# Patient Record
Sex: Female | Born: 1960 | Race: Black or African American | Hispanic: No | Marital: Single | Smoking: Never smoker
Health system: Southern US, Community
[De-identification: ages and names within clinical notes are randomized; demographics above are authoritative.]

## PROBLEM LIST (undated history)

## (undated) DIAGNOSIS — D649 Anemia, unspecified: Secondary | ICD-10-CM

## (undated) HISTORY — PX: DIAGNOSTIC LAPAROSCOPY: SUR761

## (undated) HISTORY — PX: OTHER SURGICAL HISTORY: SHX169

## (undated) HISTORY — PX: ABDOMINAL HYSTERECTOMY: SHX81

---

## 1998-03-30 ENCOUNTER — Other Ambulatory Visit: Admission: RE | Admit: 1998-03-30 | Discharge: 1998-03-30 | Payer: Self-pay | Admitting: Obstetrics and Gynecology

## 1998-04-20 ENCOUNTER — Ambulatory Visit (HOSPITAL_COMMUNITY): Admission: RE | Admit: 1998-04-20 | Discharge: 1998-04-20 | Payer: Self-pay | Admitting: Obstetrics and Gynecology

## 2001-04-19 ENCOUNTER — Emergency Department (HOSPITAL_COMMUNITY): Admission: EM | Admit: 2001-04-19 | Discharge: 2001-04-19 | Payer: Self-pay | Admitting: Emergency Medicine

## 2001-04-19 ENCOUNTER — Encounter: Payer: Self-pay | Admitting: Emergency Medicine

## 2002-07-01 ENCOUNTER — Emergency Department (HOSPITAL_COMMUNITY): Admission: EM | Admit: 2002-07-01 | Discharge: 2002-07-01 | Payer: Self-pay | Admitting: Emergency Medicine

## 2008-04-02 ENCOUNTER — Emergency Department (HOSPITAL_COMMUNITY): Admission: EM | Admit: 2008-04-02 | Discharge: 2008-04-02 | Payer: Self-pay | Admitting: Emergency Medicine

## 2010-02-16 ENCOUNTER — Encounter: Payer: Self-pay | Admitting: Obstetrics and Gynecology

## 2010-07-17 ENCOUNTER — Other Ambulatory Visit: Payer: Self-pay | Admitting: Obstetrics and Gynecology

## 2010-07-17 DIAGNOSIS — N63 Unspecified lump in unspecified breast: Secondary | ICD-10-CM

## 2010-07-22 ENCOUNTER — Other Ambulatory Visit: Payer: Self-pay

## 2010-07-23 ENCOUNTER — Ambulatory Visit
Admission: RE | Admit: 2010-07-23 | Discharge: 2010-07-23 | Disposition: A | Discharge status: Home / Self Care | Payer: BC Managed Care – PPO | Source: Ambulatory Visit | Attending: Obstetrics and Gynecology | Admitting: Obstetrics and Gynecology

## 2010-07-23 ENCOUNTER — Other Ambulatory Visit: Payer: Self-pay | Admitting: Obstetrics and Gynecology

## 2010-07-23 DIAGNOSIS — N63 Unspecified lump in unspecified breast: Secondary | ICD-10-CM

## 2011-05-05 ENCOUNTER — Encounter (HOSPITAL_COMMUNITY): Payer: Self-pay | Admitting: Pharmacist

## 2011-05-13 NOTE — H&P (Signed)
  Patient name  Louanna, Vanliew DICTATION#  [id number] CSN# [id number]  [facility], MD 05/13/2011 5:32 AM

## 2011-05-14 ENCOUNTER — Encounter (HOSPITAL_COMMUNITY)
Admission: RE | Admit: 2011-05-14 | Discharge: 2011-05-14 | Disposition: A | Discharge status: Home / Self Care | Payer: BC Managed Care – PPO | Source: Ambulatory Visit | Attending: Obstetrics and Gynecology | Admitting: Obstetrics and Gynecology

## 2011-05-14 ENCOUNTER — Encounter (HOSPITAL_COMMUNITY): Payer: Self-pay

## 2011-05-14 HISTORY — DX: Anemia, unspecified: D64.9

## 2011-05-14 LAB — SURGICAL PCR SCREEN
MRSA, PCR: NEGATIVE
Staphylococcus aureus: NEGATIVE

## 2011-05-14 LAB — CBC
Hemoglobin: 12.5 g/dL (ref 12.0–15.0)
MCH: 31 pg (ref 26.0–34.0)
MCHC: 33 g/dL (ref 30.0–36.0)
RDW: 12.5 % (ref 11.5–15.5)

## 2011-05-14 NOTE — Patient Instructions (Addendum)
   Your procedure is scheduled on: Monday April 22nd  Enter through the Main Entrance of [facility] at: [employer] up the phone at the desk and dial [phone] and inform us of your arrival.  Please call this number if you have any problems the morning of surgery: [phone]  Remember: Do not eat food after midnight: Sunday Do not drink clear liquids after: midnight Sunday Take these medicines the morning of surgery with a SIP OF WATER: none  Do not wear jewelry, make-up, or FINGER nail polish Do not wear lotions, powders, perfumes or deodorant. Do not shave 48 hours prior to surgery. Do not bring valuables to the hospital. Contacts, dentures or bridgework may not be worn into surgery.  Leave suitcase in the car. After Surgery it may be brought to your room. For patients being admitted to the hospital, checkout time is 11:00am the day of discharge.  Patients discharged on the day of surgery will not be allowed to drive home.     Remember to use your hibiclens as instructed.Please shower with 1/2 bottle the evening before your surgery and the other 1/2 bottle the morning of surgery. Neck down avoiding private area.

## 2011-05-18 NOTE — H&P (Signed)
Joanna Klein is an 51 y.o. female. G 2 P 2 presents for LAVH and BSO for symptomatic fibroids and associated anemia.  Patient with very heavy periods. HGB has been as low as 6.4.  Alternatives discussed presents for above noted surgery.  Pertinent Gynecological History: Menses: flow is excessive with use of 10 pads or tampons on heaviest days Bleeding: excessive Contraception: OCP (estrogen/progesterone) DES exposure: denies Blood transfusions: none Sexually transmitted diseases: no past history Previous GYN Procedures: none  Last mammogram: normal Last pap: normal Date: 11/11 OB History: G2, P2   Menstrual History: Menarche age: 62 No LMP recorded.    Past Medical History  Diagnosis Date  . Anemia     Past Surgical History  Procedure Date  . Diagnostic laparoscopy     laser for endometriosis  . Abcess removal age 34    on buttocks    No family history on file.  Social History:  reports that she has never smoked. She does not have any smokeless tobacco history on file. She reports that she does not drink alcohol or use illicit drugs.  Allergies: No Known Allergies  No prescriptions prior to admission    Review of Systems  All other systems reviewed and are negative.    There were no vitals taken for this visit. Physical Exam  Constitutional: She is oriented to person, place, and time. She appears well-developed and well-nourished.  HENT:  Head: Normocephalic.  Mouth/Throat: Oropharynx is clear and moist.  Eyes: Conjunctivae and EOM are normal. Pupils are equal, round, and reactive to light.  Neck: Normal range of motion. Neck supple.  Cardiovascular: Normal rate, regular rhythm and normal heart sounds.   Respiratory: Effort normal and breath sounds normal.  GI: Soft. Bowel sounds are normal. She exhibits no mass.  Genitourinary:       Normal external genitalia.  Vagina and cervix clear.  Uterus 9 weeks in size consistent with fibroids.  Adnexa clear    Musculoskeletal: Normal range of motion.  Neurological: She is alert and oriented to person, place, and time. She has normal reflexes.    No results found for this or any previous visit (from the past 24 hour(s)).  No results found.  Assessment/Plan: Uterine fibroids with associated menorrhagia.   Pan LAVH and  BSO.  Risk discussed including: Hemorrhage that could lead to transfusion with risk of AIDS and hepatitis.  Risk of infection. Risk of injury to adjacent organs such as bowel bladder of ureters that could require further surgery. Risk of DVT and PE.  Patient expresses understanding of risk and alternatives.   Shareef Eddinger S 05/18/2011, 6:39 AM

## 2011-05-19 ENCOUNTER — Encounter (HOSPITAL_COMMUNITY): Payer: Self-pay | Admitting: Anesthesiology

## 2011-05-19 ENCOUNTER — Ambulatory Visit (HOSPITAL_COMMUNITY)
Admission: RE | Admit: 2011-05-19 | Discharge: 2011-05-20 | Disposition: A | Discharge status: Home / Self Care | Payer: BC Managed Care – PPO | Source: Ambulatory Visit | Attending: Obstetrics and Gynecology | Admitting: Obstetrics and Gynecology

## 2011-05-19 ENCOUNTER — Encounter (HOSPITAL_COMMUNITY)
Admission: RE | Disposition: A | Discharge status: Home / Self Care | Payer: Self-pay | Source: Ambulatory Visit | Attending: Obstetrics and Gynecology

## 2011-05-19 ENCOUNTER — Encounter (HOSPITAL_COMMUNITY): Payer: Self-pay

## 2011-05-19 ENCOUNTER — Ambulatory Visit (HOSPITAL_COMMUNITY): Payer: BC Managed Care – PPO | Admitting: Anesthesiology

## 2011-05-19 DIAGNOSIS — D649 Anemia, unspecified: Secondary | ICD-10-CM | POA: Insufficient documentation

## 2011-05-19 DIAGNOSIS — Z01812 Encounter for preprocedural laboratory examination: Secondary | ICD-10-CM | POA: Insufficient documentation

## 2011-05-19 DIAGNOSIS — Z01818 Encounter for other preprocedural examination: Secondary | ICD-10-CM | POA: Insufficient documentation

## 2011-05-19 DIAGNOSIS — Z90710 Acquired absence of both cervix and uterus: Secondary | ICD-10-CM

## 2011-05-19 DIAGNOSIS — D259 Leiomyoma of uterus, unspecified: Secondary | ICD-10-CM | POA: Insufficient documentation

## 2011-05-19 DIAGNOSIS — N831 Corpus luteum cyst of ovary, unspecified side: Secondary | ICD-10-CM | POA: Insufficient documentation

## 2011-05-19 DIAGNOSIS — N840 Polyp of corpus uteri: Secondary | ICD-10-CM | POA: Insufficient documentation

## 2011-05-19 DIAGNOSIS — N920 Excessive and frequent menstruation with regular cycle: Secondary | ICD-10-CM | POA: Insufficient documentation

## 2011-05-19 DIAGNOSIS — N838 Other noninflammatory disorders of ovary, fallopian tube and broad ligament: Secondary | ICD-10-CM | POA: Insufficient documentation

## 2011-05-19 HISTORY — PX: LAPAROSCOPIC ASSISTED VAGINAL HYSTERECTOMY: SHX5398

## 2011-05-19 HISTORY — PX: SALPINGOOPHORECTOMY: SHX82

## 2011-05-19 LAB — HCG, SERUM, QUALITATIVE: Preg, Serum: NEGATIVE

## 2011-05-19 SURGERY — HYSTERECTOMY, VAGINAL, LAPAROSCOPY-ASSISTED
Anesthesia: General | Site: Abdomen | Wound class: Clean Contaminated

## 2011-05-19 MED ORDER — BUPIVACAINE HCL (PF) 0.25 % IJ SOLN
INTRAMUSCULAR | Status: DC | PRN
  Administered 2011-05-19: 5 mL

## 2011-05-19 MED ORDER — ONDANSETRON HCL 4 MG/2ML IJ SOLN: 4.0000 mg | Freq: Four times a day (QID) | INTRAMUSCULAR | Status: DC | PRN

## 2011-05-19 MED ORDER — PROPOFOL 10 MG/ML IV EMUL
INTRAVENOUS | Status: DC | PRN
  Administered 2011-05-19: 170 mg via INTRAVENOUS

## 2011-05-19 MED ORDER — METOCLOPRAMIDE HCL 5 MG/ML IJ SOLN
INTRAMUSCULAR | Status: AC
  Filled 2011-05-19: qty 2

## 2011-05-19 MED ORDER — LIDOCAINE HCL (CARDIAC) 20 MG/ML IV SOLN
INTRAVENOUS | Status: AC
  Filled 2011-05-19: qty 5

## 2011-05-19 MED ORDER — PROPOFOL 10 MG/ML IV EMUL
INTRAVENOUS | Status: AC
  Filled 2011-05-19: qty 20

## 2011-05-19 MED ORDER — SODIUM CHLORIDE 0.9 % IJ SOLN: 9.0000 mL | INTRAMUSCULAR | Status: DC | PRN

## 2011-05-19 MED ORDER — HYDROMORPHONE HCL PF 1 MG/ML IJ SOLN
INTRAMUSCULAR | Status: AC
  Administered 2011-05-19: 0.5 mg via INTRAVENOUS
  Filled 2011-05-19: qty 1

## 2011-05-19 MED ORDER — HYDROMORPHONE 0.3 MG/ML IV SOLN
INTRAVENOUS | Status: AC
  Filled 2011-05-19: qty 25

## 2011-05-19 MED ORDER — ACETAMINOPHEN 325 MG PO TABS: 650.0000 mg | ORAL_TABLET | ORAL | Status: DC | PRN

## 2011-05-19 MED ORDER — BUPIVACAINE-EPINEPHRINE (PF) 0.5% -1:200000 IJ SOLN
INTRAMUSCULAR | Status: AC
  Filled 2011-05-19: qty 10

## 2011-05-19 MED ORDER — ONDANSETRON HCL 4 MG/2ML IJ SOLN
INTRAMUSCULAR | Status: AC
  Filled 2011-05-19: qty 2

## 2011-05-19 MED ORDER — ESMOLOL HCL 10 MG/ML IV SOLN: INTRAVENOUS | Status: DC | PRN

## 2011-05-19 MED ORDER — DIPHENHYDRAMINE HCL 50 MG/ML IJ SOLN: 12.5000 mg | Freq: Four times a day (QID) | INTRAMUSCULAR | Status: DC | PRN

## 2011-05-19 MED ORDER — ZOLPIDEM TARTRATE 5 MG PO TABS: 5.0000 mg | ORAL_TABLET | Freq: Every evening | ORAL | Status: DC | PRN

## 2011-05-19 MED ORDER — ONDANSETRON HCL 4 MG/2ML IJ SOLN
INTRAMUSCULAR | Status: DC | PRN
  Administered 2011-05-19: 4 mg via INTRAVENOUS

## 2011-05-19 MED ORDER — BUPIVACAINE HCL (PF) 0.25 % IJ SOLN
INTRAMUSCULAR | Status: AC
  Filled 2011-05-19: qty 30

## 2011-05-19 MED ORDER — MIDAZOLAM HCL 2 MG/2ML IJ SOLN
INTRAMUSCULAR | Status: AC
  Filled 2011-05-19: qty 2

## 2011-05-19 MED ORDER — LACTATED RINGERS IV SOLN
INTRAVENOUS | Status: DC
  Administered 2011-05-19: 08:00:00 via INTRAVENOUS
  Administered 2011-05-19: 125 mL/h via INTRAVENOUS

## 2011-05-19 MED ORDER — HYDROMORPHONE 0.3 MG/ML IV SOLN
INTRAVENOUS | Status: DC
  Administered 2011-05-19: 0.2 mg via INTRAVENOUS
  Administered 2011-05-19: 11:00:00 via INTRAVENOUS

## 2011-05-19 MED ORDER — MENTHOL 3 MG MT LOZG: 1.0000 | LOZENGE | OROMUCOSAL | Status: DC | PRN

## 2011-05-19 MED ORDER — OXYCODONE-ACETAMINOPHEN 5-325 MG PO TABS: 1.0000 | ORAL_TABLET | ORAL | Status: DC | PRN

## 2011-05-19 MED ORDER — PHENYLEPHRINE HCL 10 MG/ML IJ SOLN
INTRAMUSCULAR | Status: DC | PRN
  Administered 2011-05-19: 80 ug via INTRAVENOUS

## 2011-05-19 MED ORDER — PROMETHAZINE HCL 25 MG/ML IJ SOLN
6.2500 mg | INTRAMUSCULAR | Status: DC | PRN
  Administered 2011-05-19: 6.25 mg via INTRAVENOUS

## 2011-05-19 MED ORDER — DIPHENHYDRAMINE HCL 12.5 MG/5ML PO ELIX
12.5000 mg | ORAL_SOLUTION | Freq: Four times a day (QID) | ORAL | Status: DC | PRN
Start: 2011-05-19 — End: 2011-05-20

## 2011-05-19 MED ORDER — MIDAZOLAM HCL 5 MG/5ML IJ SOLN
INTRAMUSCULAR | Status: DC | PRN
  Administered 2011-05-19: 2 mg via INTRAVENOUS

## 2011-05-19 MED ORDER — INDIGOTINDISULFONATE SODIUM 8 MG/ML IJ SOLN
INTRAMUSCULAR | Status: DC | PRN
  Administered 2011-05-19: 5 mL via INTRAVENOUS

## 2011-05-19 MED ORDER — STERILE WATER FOR IRRIGATION IR SOLN
Status: DC | PRN
  Administered 2011-05-19: 1000 mL

## 2011-05-19 MED ORDER — KETOROLAC TROMETHAMINE 30 MG/ML IJ SOLN: 15.0000 mg | Freq: Once | INTRAMUSCULAR | Status: DC | PRN

## 2011-05-19 MED ORDER — CEFAZOLIN SODIUM 1-5 GM-% IV SOLN
1.0000 g | INTRAVENOUS | Status: AC
  Administered 2011-05-19: 1 g via INTRAVENOUS

## 2011-05-19 MED ORDER — FENTANYL CITRATE 0.05 MG/ML IJ SOLN
INTRAMUSCULAR | Status: AC
  Filled 2011-05-19: qty 5

## 2011-05-19 MED ORDER — LACTATED RINGERS IR SOLN
Status: DC | PRN
  Administered 2011-05-19: 3000 mL

## 2011-05-19 MED ORDER — PROMETHAZINE HCL 25 MG/ML IJ SOLN
INTRAMUSCULAR | Status: AC
  Administered 2011-05-19: 6.25 mg via INTRAVENOUS
  Filled 2011-05-19: qty 1

## 2011-05-19 MED ORDER — FENTANYL CITRATE 0.05 MG/ML IJ SOLN
INTRAMUSCULAR | Status: DC | PRN
  Administered 2011-05-19: 100 ug via INTRAVENOUS
  Administered 2011-05-19 (×2): 50 ug via INTRAVENOUS
  Administered 2011-05-19: 25 ug via INTRAVENOUS
  Administered 2011-05-19 (×2): 50 ug via INTRAVENOUS
  Administered 2011-05-19: 25 ug via INTRAVENOUS
  Administered 2011-05-19: 50 ug via INTRAVENOUS

## 2011-05-19 MED ORDER — LACTATED RINGERS IV SOLN
INTRAVENOUS | Status: DC
  Administered 2011-05-19 – 2011-05-20 (×3): via INTRAVENOUS

## 2011-05-19 MED ORDER — PHENYLEPHRINE 40 MCG/ML (10ML) SYRINGE FOR IV PUSH (FOR BLOOD PRESSURE SUPPORT)
PREFILLED_SYRINGE | INTRAVENOUS | Status: AC
  Filled 2011-05-19: qty 5

## 2011-05-19 MED ORDER — DEXAMETHASONE SODIUM PHOSPHATE 10 MG/ML IJ SOLN
INTRAMUSCULAR | Status: DC | PRN
  Administered 2011-05-19: 10 mg via INTRAVENOUS

## 2011-05-19 MED ORDER — INDIGOTINDISULFONATE SODIUM 8 MG/ML IJ SOLN
INTRAMUSCULAR | Status: AC
  Filled 2011-05-19: qty 5

## 2011-05-19 MED ORDER — FENTANYL CITRATE 0.05 MG/ML IJ SOLN
INTRAMUSCULAR | Status: AC
Start: 2011-05-19 — End: 2011-05-19
  Filled 2011-05-19: qty 5

## 2011-05-19 MED ORDER — NALOXONE HCL 0.4 MG/ML IJ SOLN: 0.4000 mg | INTRAMUSCULAR | Status: DC | PRN

## 2011-05-19 MED ORDER — ROCURONIUM BROMIDE 50 MG/5ML IV SOLN
INTRAVENOUS | Status: AC
  Filled 2011-05-19: qty 1

## 2011-05-19 MED ORDER — KETOROLAC TROMETHAMINE 30 MG/ML IJ SOLN
INTRAMUSCULAR | Status: AC
  Filled 2011-05-19: qty 1

## 2011-05-19 MED ORDER — ROCURONIUM BROMIDE 100 MG/10ML IV SOLN
INTRAVENOUS | Status: DC | PRN
  Administered 2011-05-19: 40 mg via INTRAVENOUS

## 2011-05-19 MED ORDER — ONDANSETRON HCL 4 MG PO TABS: 4.0000 mg | ORAL_TABLET | Freq: Four times a day (QID) | ORAL | Status: DC | PRN

## 2011-05-19 MED ORDER — ESMOLOL HCL 10 MG/ML IV SOLN
INTRAVENOUS | Status: AC
  Filled 2011-05-19: qty 10

## 2011-05-19 MED ORDER — METOCLOPRAMIDE HCL 5 MG/ML IJ SOLN: 10.0000 mg | Freq: Once | INTRAMUSCULAR | Status: DC

## 2011-05-19 MED ORDER — KETOROLAC TROMETHAMINE 30 MG/ML IJ SOLN
INTRAMUSCULAR | Status: DC | PRN
  Administered 2011-05-19: 30 mg via INTRAVENOUS

## 2011-05-19 MED ORDER — LIDOCAINE HCL (CARDIAC) 20 MG/ML IV SOLN
INTRAVENOUS | Status: DC | PRN
  Administered 2011-05-19: 50 mg via INTRAVENOUS

## 2011-05-19 MED ORDER — DEXAMETHASONE SODIUM PHOSPHATE 10 MG/ML IJ SOLN
INTRAMUSCULAR | Status: AC
  Filled 2011-05-19: qty 1

## 2011-05-19 MED ORDER — CEFAZOLIN SODIUM 1-5 GM-% IV SOLN
INTRAVENOUS | Status: AC
  Filled 2011-05-19: qty 50

## 2011-05-19 MED ORDER — HYDROMORPHONE HCL PF 1 MG/ML IJ SOLN
0.2500 mg | INTRAMUSCULAR | Status: DC | PRN
  Administered 2011-05-19 (×2): 0.5 mg via INTRAVENOUS

## 2011-05-19 MED ORDER — ESMOLOL HCL 10 MG/ML IV SOLN
INTRAVENOUS | Status: DC | PRN
  Administered 2011-05-19: 30 mg via INTRAVENOUS

## 2011-05-19 SURGICAL SUPPLY — 38 items
CABLE HIGH FREQUENCY MONO STRZ (ELECTRODE) IMPLANT
CATH ROBINSON RED A/P 16FR (CATHETERS) ×3 IMPLANT
CLOTH BEACON ORANGE TIMEOUT ST (SAFETY) ×3 IMPLANT
CONT PATH 16OZ SNAP LID 3702 (MISCELLANEOUS) ×3 IMPLANT
COVER TABLE BACK 60X90 (DRAPES) ×3 IMPLANT
DECANTER SPIKE VIAL GLASS SM (MISCELLANEOUS) IMPLANT
DERMABOND ADVANCED (GAUZE/BANDAGES/DRESSINGS) ×1
DERMABOND ADVANCED .7 DNX12 (GAUZE/BANDAGES/DRESSINGS) ×2 IMPLANT
ELECT REM PT RETURN 9FT ADLT (ELECTROSURGICAL) ×3
ELECTRODE REM PT RTRN 9FT ADLT (ELECTROSURGICAL) ×2 IMPLANT
GLOVE BIO SURGEON STRL SZ7 (GLOVE) ×9 IMPLANT
GLOVE BIOGEL PI IND STRL 6.5 (GLOVE) ×2 IMPLANT
GLOVE BIOGEL PI INDICATOR 6.5 (GLOVE) ×1
GOWN PREVENTION PLUS LG XLONG (DISPOSABLE) ×9 IMPLANT
NEEDLE INSUFFLATION 14GA 120MM (NEEDLE) IMPLANT
NS IRRIG 1000ML POUR BTL (IV SOLUTION) ×3 IMPLANT
PACK LAVH (CUSTOM PROCEDURE TRAY) ×3 IMPLANT
PROTECTOR NERVE ULNAR (MISCELLANEOUS) ×3 IMPLANT
SEALER TISSUE G2 CVD JAW 45CM (ENDOMECHANICALS) ×3 IMPLANT
SET CYSTO W/LG BORE CLAMP LF (SET/KITS/TRAYS/PACK) ×3 IMPLANT
SET IRRIG TUBING LAPAROSCOPIC (IRRIGATION / IRRIGATOR) ×3 IMPLANT
STRIP CLOSURE SKIN 1/4X3 (GAUZE/BANDAGES/DRESSINGS) IMPLANT
SUT MON AB 2-0 CT1 27 (SUTURE) ×9 IMPLANT
SUT VIC AB 0 CT1 18XCR BRD8 (SUTURE) ×4 IMPLANT
SUT VIC AB 0 CT1 27 (SUTURE) ×1
SUT VIC AB 0 CT1 27XBRD ANBCTR (SUTURE) ×2 IMPLANT
SUT VIC AB 0 CT1 36 (SUTURE) ×6 IMPLANT
SUT VIC AB 0 CT1 8-18 (SUTURE) ×2
SUT VICRYL 0 UR6 27IN ABS (SUTURE) ×6 IMPLANT
SUT VICRYL 1 TIES 12X18 (SUTURE) ×3 IMPLANT
SUT VICRYL 4-0 PS2 18IN ABS (SUTURE) ×3 IMPLANT
TOWEL OR 17X24 6PK STRL BLUE (TOWEL DISPOSABLE) ×6 IMPLANT
TRAY FOLEY CATH 14FR (SET/KITS/TRAYS/PACK) ×3 IMPLANT
TROCAR BALLN 12MMX100 BLUNT (TROCAR) ×3 IMPLANT
TROCAR Z-THREAD BLADED 11X100M (TROCAR) IMPLANT
TROCAR Z-THREAD BLADED 5X100MM (TROCAR) ×3 IMPLANT
WARMER LAPAROSCOPE (MISCELLANEOUS) ×3 IMPLANT
WATER STERILE IRR 1000ML POUR (IV SOLUTION) IMPLANT

## 2011-05-19 NOTE — Anesthesia Preprocedure Evaluation (Signed)
Anesthesia Evaluation  Patient identified by MRN, date of birth, ID band Patient awake    Reviewed: Allergy & Precautions, H&P , NPO status , Patient's Chart, lab work & pertinent test results, reviewed documented beta blocker date and time   History of Anesthesia Complications Negative for: history of anesthetic complications  Airway Mallampati: I TM Distance: >3 FB Neck ROM: full    Dental  (+) Teeth Intact   Pulmonary neg pulmonary ROS,  breath sounds clear to auscultation  Pulmonary exam normal       Cardiovascular Exercise Tolerance: Good negative cardio ROS  Rhythm:regular Rate:Normal     Neuro/Psych negative neurological ROS  negative psych ROS   GI/Hepatic negative GI ROS, Neg liver ROS,   Endo/Other  negative endocrine ROS  Renal/GU negative Renal ROS  Female GU complaint     Musculoskeletal   Abdominal   Peds  Hematology  (+) anemia ,   Anesthesia Other Findings   Reproductive/Obstetrics negative OB ROS                           Anesthesia Physical Anesthesia Plan  ASA: I  Anesthesia Plan: General ETT   Post-op Pain Management:    Induction:   Airway Management Planned:   Additional Equipment:   Intra-op Plan:   Post-operative Plan:   Informed Consent: I have reviewed the patients History and Physical, chart, labs and discussed the procedure including the risks, benefits and alternatives for the proposed anesthesia with the patient or authorized representative who has indicated his/her understanding and acceptance.   Dental Advisory Given  Plan Discussed with: CRNA and Surgeon  Anesthesia Plan Comments:         Anesthesia Quick Evaluation

## 2011-05-19 NOTE — Brief Op Note (Signed)
05/19/2011  9:05 AM  PATIENT:  Joanna Klein  51 y.o. female  PRE-OPERATIVE DIAGNOSIS:  Menorrhagia, Fibroids  POST-OPERATIVE DIAGNOSIS:  Menorrhagia, Fibroids  PROCEDURE:  Procedure(s) (LRB): LAPAROSCOPIC ASSISTED VAGINAL HYSTERECTOMY (N/A) SALPINGO OOPHERECTOMY (Bilateral)  SURGEON:  Surgeon(s) and Role:    * Juluis Mire, MD - Primary    * Meriel Pica, MD - Assisting  PHYSICIAN ASSISTANT:   ASSISTANTS: holland    ANESTHESIA:   general  EBL:  Total I/O In: 1000 [I.V.:1000] Out: 300 [Urine:50; Blood:250]  BLOOD ADMINISTERED:none  DRAINS: Urinary Catheter (Foley)   LOCAL MEDICATIONS USED:  MARCAINE     SPECIMEN:  Source of Specimen:  uterus tubes and ovaries  DISPOSITION OF SPECIMEN:  PATHOLOGY  COUNTS:  YES  TOURNIQUET:  * No tourniquets in log *  DICTATION: .Other Dictation: Dictation Number [id number]  PLAN OF CARE: Admit for overnight observation  PATIENT DISPOSITION:  PACU - hemodynamically stable.   Delay start of Pharmacological VTE agent (>24hrs) due to surgical blood loss or risk of bleeding: no

## 2011-05-19 NOTE — Addendum Note (Signed)
Addendum  created 05/19/11 1626 by Armanda Heritage, RN   Modules edited:Anesthesia Events, Notes Section

## 2011-05-19 NOTE — Transfer of Care (Signed)
Immediate Anesthesia Transfer of Care Note  Patient: Joanna Klein  Procedure(s) Performed: Procedure(s) (LRB): LAPAROSCOPIC ASSISTED VAGINAL HYSTERECTOMY (N/A) SALPINGO OOPHERECTOMY (Bilateral)  Patient Location: PACU  Anesthesia Type: General  Level of Consciousness: awake and sedated  Airway & Oxygen Therapy: Patient Spontanous Breathing and Patient connected to nasal cannula oxygen  Post-op Assessment: Report given to PACU RN  Post vital signs: Reviewed and stable  Complications: No apparent anesthesia complications

## 2011-05-19 NOTE — Anesthesia Postprocedure Evaluation (Signed)
  Anesthesia Post-op Note  Patient: Joanna Klein  Procedure(s) Performed: Procedure(s) (LRB): LAPAROSCOPIC ASSISTED VAGINAL HYSTERECTOMY (N/A) SALPINGO OOPHERECTOMY (Bilateral)  Patient Location: PACU and Women's Unit  Anesthesia Type: General  Level of Consciousness: awake, alert  and oriented  Airway and Oxygen Therapy: Patient connected to nasal cannula oxygen  Post-op Pain: mild  Post-op Assessment: Post-op Vital signs reviewed  Post-op Vital Signs: Reviewed and stable  Complications: No apparent anesthesia complications

## 2011-05-19 NOTE — Progress Notes (Signed)
Patient ID: Joanna Klein, female   DOB: 04-11-1960, 51 y.o.   MRN: [id number] AF VSS ABD SOFT  DRESSING DRY GOOD UO

## 2011-05-19 NOTE — Op Note (Signed)
NAMEDAPHENE, CHISHOLM NO.:  [id number]  MEDICAL RECORD NO.:  [id number]  LOCATION:  [facility]                          FACILITY:  [facility]  PHYSICIAN:  Juluis Mire, M.D.   DATE OF BIRTH:  [birth year]  DATE OF PROCEDURE:  05/19/2011 DATE OF DISCHARGE:                              OPERATIVE REPORT   PREOPERATIVE DIAGNOSIS:  Uterine fibroids.  POSTOPERATIVE DIAGNOSIS:  Uterine fibroids.  OPERATIVE PROCEDURE:  Laparoscopic-assisted vaginal hysterectomy with bilateral salpingo-oophorectomy.  SURGEON:  Juluis Mire, MD.  ASSISTANT:  Duke Salvia. Marcelle Overlie, MD.  ANESTHESIA:  General endotracheal.  ESTIMATED BLOOD LOSS:  300-400 mL.  PACKS:  None.  DRAINS:  Included urethral Foley.  INTRAOPERATIVE BLOOD REPLACEMENT:  None.  COMPLICATIONS:  None.  INDICATIONS:  As dictated in history and physical.  PROCEDURE IN DETAIL:  The patient was taken to the OR and placed in supine position.  After satisfactory level of general endotracheal anesthesia was obtained, the patient was placed in the dorsal lithotomy position using Allen stirrups.  The abdomen, perineum, and vagina prepped out with Betadine.  Bladder was then emptied by in and out catheterization.  A Hulka tenaculum was put in place and secured.  The patient was then draped in a sterile field.  Subumbilical incision was made with a knife.  The incision was extended through the subcutaneous tissue down to the fascia.  The fascia was entered sharply.  The peritoneum was entered with blunt finger pressure.  An open laparoscopic trocar was put in place and secured.  The abdomen was insufflated with carbon dioxide.  Laparoscope was introduced.  There was no evidence of injury to adjacent organs.  A 5-mm trocar was put in place under direct visualization.  Visualization revealed the appendix to be normal.  Upper abdomen including liver, tip of the gallbladder were clear.  Uterus was enlarged with multiple fibroids.   Tubes and ovaries were unremarkable. The ureters could be seen coursing in the pelvic sidewall well above the ovarian vasculature.  First went to the left side.  Using the enseal, the left ovarian vasculature was cauterized and incised.  The mesenteric attachments to the ovary and tubes were cauterized and incised up to the round ligament, then the round ligament was cauterized and incised.  We then went to the right side, where the right ovarian vasculature was cauterized and incised using the enseal.  The mesenteric attachments were cauterized, incised up to the round ligament.  The round ligament was cauterized and incised.  At this point, the decision was to go vaginally.  The abdomen was deflated with carbon dioxide.  The laparoscope was removed.  The patient's legs were repositioned.  The Hulka tenaculum was removed. Weighted speculum was placed in the vaginal vault.  The cervix was grasped with Christella Hartigan tenaculum.  Cul-de-sac was entered sharply.  Both uterosacral ligaments were clamped, cut, and suture ligated with 0 Vicryl.  The reflection of the vaginal mucosa anteriorly was incised and bladder was dissected superiorly.  Paracervical tissue was then clamped, cut, and suture ligated with 0 Vicryl.  Using the clamp, cut, and tie technique with suture ligatures of 0 Vicryl, the parametrium serially separated  from the side of the uterus.  Vesicouterine space was entered. Retractors put in place.  At this point in time, we began morselization. The lower uterine and cervix were first excised.  Then, we rotated the fundus from posterior to anteriorly, removed the large wedges of the uterus until we were able to deliver the fundus through the vaginal canal.  Pedicles were clamped and cut, and the fundus was passed off the operative site.  Uterus, tubes, and ovaries were sent to pathology. Held pedicles were secured with free tie of 0 Vicryl.  A running locking suture of 0 Vicryl was used  to run the posterior vaginal cuff.  Vaginal mucosa was then reapproximated with figure-of-eight 2-0 Monocryl.  At this point in time, cystoscopy was performed.  There was no evidence of injury to the bladder.  Blue tinged urine was noted to be jetting from each ureteral orifice.  The patient had been given indigo carmine.  The cystoscope was removed.  Foley was placed to straight drain.  The patient's legs were repositioned.  Laparoscope was reintroduced. Visualization revealed some bleeding from the left side of the vaginal cuff, brought under control with the bipolar.  Both ovarian sites were hemostatically intact.  We irrigated the pelvis.  With the irrigation, no active bleeding was encountered.  The abdomen was deflated with carbon dioxide, reinflated.  No venous bleeding was noted.  At this point in time, the abdomen was deflated of carbon dioxide.  All trocars were removed.  Subumbilical fascia closed with figure-of-eight of 0 Vicryl.  Skin was closed with interrupted subcuticulars of 4-0 Vicryl. Suprapubic incision was closed with Dermabond.  Sponge, instrument, and needle count reported as correct by circulating nurse x2.  Foley catheter was blue tinged, but no blood was noted in the urine.  The patient was transferred to the recovery room in good condition.     Juluis Mire, M.D.     JSM/MEDQ  D:  05/19/2011  T:  05/19/2011  Job:  [id number]

## 2011-05-19 NOTE — Anesthesia Postprocedure Evaluation (Signed)
  Anesthesia Post-op Note  Patient: Joanna Klein  Procedure(s) Performed: Procedure(s) (LRB): LAPAROSCOPIC ASSISTED VAGINAL HYSTERECTOMY (N/A) SALPINGO OOPHERECTOMY (Bilateral)  Patient Location: PACU  Anesthesia Type: General  Level of Consciousness: awake, alert  and oriented  Airway and Oxygen Therapy: Patient Spontanous Breathing  Post-op Pain: mild  Post-op Assessment: Post-op Vital signs reviewed, Patient's Cardiovascular Status Stable, Respiratory Function Stable, Patent Airway and Pain level controlled. Had some Nausea in PACU treated and improved.  Post-op Vital Signs: Reviewed and stable  Complications: No apparent anesthesia complications

## 2011-05-19 NOTE — H&P (Signed)
  History and physical exam unchanged 

## 2011-05-20 ENCOUNTER — Encounter (HOSPITAL_COMMUNITY): Payer: Self-pay | Admitting: Obstetrics and Gynecology

## 2011-05-20 LAB — CBC
Hemoglobin: 9.6 g/dL — ABNORMAL LOW (ref 12.0–15.0)
MCH: 31.1 pg (ref 26.0–34.0)
MCV: 93.5 fL (ref 78.0–100.0)
Platelets: 169 10*3/uL (ref 150–400)
RBC: 3.09 MIL/uL — ABNORMAL LOW (ref 3.87–5.11)
WBC: 8.4 10*3/uL (ref 4.0–10.5)

## 2011-05-20 NOTE — Discharge Summary (Signed)
NAMESHAHARA, HARTSFIELD NO.:  [id number]  MEDICAL RECORD NO.:  [id number]  LOCATION:  9302                          FACILITY:  [facility]  PHYSICIAN:  Juluis Mire, M.D.   DATE OF BIRTH:  April 11, 1960  DATE OF ADMISSION:  05/19/2011 DATE OF DISCHARGE:  05/20/2011                              DISCHARGE SUMMARY   ADMITTING DIAGNOSIS:  Uterine fibroids.  DISCHARGE DIAGNOSIS:  Uterine fibroids.  OPERATIVE PROCEDURE:  Laparoscopic-assisted vaginal hysterectomy with bilateral salpingo-oophorectomy.  For complete history and physical, please see H and P.  COURSE IN THE HOSPITAL:  The patient underwent above-noted surgery. Postop did well.  Postop hemoglobin was 9.6.  On her first postop day, she was afebrile, stable vital signs.  She was tolerating a diet.  Foley had been discontinued. She voided without difficulty and was ambulating without difficulty.  Her abdomen is soft, nontender.  All bowels bowel sounds are active.  All incisions were clear.  She has had minimal vaginal drainage.  In terms of complications, none were encountered in stay in the hospital.  The patient discharged home in stable condition.  DISPOSITION:  The patient is given postop instructions.  She is to avoid heavy lifting, driving a car, vaginal entrance.  She is to watch for signs of infection, terms of fever.  Call with active vaginal bleeding. She is to call with increasing nausea, vomiting.  Call with excessive pain.  Instructed of signs and symptoms of deep venous thrombosis and pulmonary embolus.  Discharged on Tylox needed for pain.  The office will call tomorrow, arrange followup in 1 week.     Juluis Mire, M.D.     JSM/MEDQ  D:  05/20/2011  T:  05/20/2011  Job:  [id number]

## 2011-05-20 NOTE — Progress Notes (Signed)
Pt d/c teaching complete  . Out with family . Voided  500cc of urine . Verlon Setting  NT  escorted pt out .

## 2011-05-20 NOTE — Discharge Instructions (Signed)
Hysterectomy Care After Refer to this sheet in the next few weeks. These instructions provide you with information on caring for yourself after your procedure. Your caregiver may also give you more specific instructions. Your treatment has been planned according to current medical practices, but problems sometimes occur. Call your caregiver if you have any problems or questions after your procedure. HOME CARE INSTRUCTIONS  Healing will take time. You may have discomfort, tenderness, swelling, and bruising at the surgical site for about 2 weeks. This is normal and will get better as time goes on.  Only take over-the-counter or prescription medicines for pain, discomfort, or fever as directed by your caregiver.   Do not take aspirin. It can cause bleeding.   Do not drive when taking pain medicine.   Follow your caregiver's advice regarding exercise, lifting, driving, and general activities.   Resume your usual diet as directed and allowed.   Get plenty of rest and sleep.   Do not douche, use tampons, or have sexual intercourse for at least 6 weeks or until your caregiver gives you permission.   Change your bandages (dressings) as directed by your caregiver.   Monitor your temperature.   Take showers instead of baths for 2 to 3 weeks.   Do not drink alcohol until your caregiver gives you permission.   If you are constipated, you may take a mild laxative with your caregiver's permission. Bran foods may help with constipation problems. Drinking enough fluids to keep your urine clear or pale yellow may help as well.   Try to have someone home with you for 1 or 2 weeks to help around the house.   Keep all of your follow-up appointments as directed by your caregiver.  SEEK MEDICAL CARE IF:   You have swelling, redness, or increasing pain in the surgical cut (incision) area.   You have pus coming from the incision.   You notice a bad smell coming from the incision or dressing.   You  have swelling, redness, or pain around the intravenous (IV) site.   Your incision breaks open.   You feel dizzy or lightheaded.   You have pain or bleeding when you urinate.   You have persistent diarrhea.   You have persistent nausea and vomiting.   You have abnormal vaginal discharge.   You have a rash.   You have any type of abnormal reaction or develop an allergy to your medicine.   Your pain is not controlled with your prescribed medicine.  SEEK IMMEDIATE MEDICAL CARE IF:   You have a fever.   You have severe abdominal pain.   You have chest pain.   You have shortness of breath.   You faint.   You have pain, swelling, or redness of your leg.   You have heavy vaginal bleeding with blood clots.  MAKE SURE YOU:  Understand these instructions.   Will watch your condition.   Will get help right away if you are not doing well or get worse.  Document Released: 08/02/2004 Document Revised: 01/02/2011 Document Reviewed: 08/30/2010 [employer] Patient Information 2012 [employer]. 

## 2011-05-20 NOTE — Discharge Summary (Signed)
  Patient name  Joanna Klein, regner DICTATION#  [id number] CSN# [id number]  Juluis Mire, MD 05/20/2011 8:47 AM

## 2011-05-20 NOTE — Progress Notes (Signed)
1 Day Post-Op Procedure(s) (LRB): LAPAROSCOPIC ASSISTED VAGINAL HYSTERECTOMY (N/A) SALPINGO OOPHERECTOMY (Bilateral)  Subjective: Patient reports tolerating PO.    Objective: I have reviewed patient's vital signs and labs.  Resp: clear to auscultation bilaterally GI: soft, non-tender; bowel sounds normal; no masses,  no organomegaly Vaginal Bleeding: minimal Inc clear Assessment: s/p Procedure(s) (LRB): LAPAROSCOPIC ASSISTED VAGINAL HYSTERECTOMY (N/A) SALPINGO OOPHERECTOMY (Bilateral): stable  Plan: Discharge home  LOS: 1 day    Vicky Mccanless S 05/20/2011, 8:38 AM

## 2011-07-31 ENCOUNTER — Encounter (HOSPITAL_COMMUNITY): Payer: Self-pay

## 2011-07-31 ENCOUNTER — Emergency Department (HOSPITAL_COMMUNITY)
Admission: EM | Admit: 2011-07-31 | Discharge: 2011-07-31 | Disposition: A | Discharge status: Home / Self Care | Payer: BC Managed Care – PPO | Attending: Emergency Medicine | Admitting: Emergency Medicine

## 2011-07-31 DIAGNOSIS — D649 Anemia, unspecified: Secondary | ICD-10-CM | POA: Insufficient documentation

## 2011-07-31 DIAGNOSIS — T783XXA Angioneurotic edema, initial encounter: Secondary | ICD-10-CM | POA: Insufficient documentation

## 2011-07-31 DIAGNOSIS — X58XXXA Exposure to other specified factors, initial encounter: Secondary | ICD-10-CM | POA: Insufficient documentation

## 2011-07-31 MED ORDER — FAMOTIDINE 20 MG PO TABS
20.0000 mg | ORAL_TABLET | Freq: Once | ORAL | Status: AC
  Administered 2011-07-31: 20 mg via ORAL
  Filled 2011-07-31: qty 1

## 2011-07-31 MED ORDER — FAMOTIDINE 20 MG PO TABS: 20.0000 mg | ORAL_TABLET | Freq: Two times a day (BID) | ORAL | Status: DC

## 2011-07-31 MED ORDER — DIPHENHYDRAMINE HCL 25 MG PO CAPS: 25.0000 mg | ORAL_CAPSULE | Freq: Four times a day (QID) | ORAL | Status: DC | PRN

## 2011-07-31 MED ORDER — PREDNISONE 20 MG PO TABS
60.0000 mg | ORAL_TABLET | Freq: Once | ORAL | Status: AC
  Administered 2011-07-31: 60 mg via ORAL
  Filled 2011-07-31: qty 3

## 2011-07-31 MED ORDER — PREDNISONE 20 MG PO TABS: 40.0000 mg | ORAL_TABLET | Freq: Every day | ORAL | Status: DC

## 2011-07-31 NOTE — ED Provider Notes (Signed)
Medical screening examination/treatment/procedure(s) were performed by non-physician practitioner and as supervising physician I was immediately available for consultation/collaboration.  Flint Melter, MD 07/31/11 2322

## 2011-07-31 NOTE — ED Provider Notes (Signed)
History     CSN: [id number]  Arrival date & time 07/31/11  0514   First MD Initiated Contact with Patient 07/31/11 [phone]      Chief Complaint  Patient presents with  . Facial Swelling    (Consider location/radiation/quality/duration/timing/severity/associated sxs/prior treatment) Patient is a 51 y.o. female presenting with allergic reaction. The history is provided by the patient.  Allergic Reaction The primary symptoms are  rash, angioedema and urticaria. The primary symptoms do not include wheezing, cough, abdominal pain, nausea, vomiting, dizziness or palpitations.  Significant symptoms also include eye redness.  Pt states about a month ago she started having hives all over her body after being stung by a flying insect. States hives only appear in the morning, except for few times she noted them before going to bed. Hives are there daily. This morning noted swelling of upper and lower lip, which is why she came here. No medications taken prior to arrival. Not taking any medications on regular basis. No new house hold products, no new soaps, detergent, personal products.  Past Medical History  Diagnosis Date  . Anemia     Past Surgical History  Procedure Date  . Diagnostic laparoscopy     laser for endometriosis  . Abcess removal age 25    on buttocks  . Laparoscopic assisted vaginal hysterectomy 05/19/2011    Procedure: LAPAROSCOPIC ASSISTED VAGINAL HYSTERECTOMY;  Surgeon: Juluis Mire, MD;  Location: [facility];  Service: Gynecology;  Laterality: N/A;  Cystoscopy  . Salpingoophorectomy 05/19/2011    Procedure: SALPINGO OOPHERECTOMY;  Surgeon: Juluis Mire, MD;  Location: [facility];  Service: Gynecology;  Laterality: Bilateral;    No family history on file.  History  Substance Use Topics  . Smoking status: Never Smoker   . Smokeless tobacco: Not on file  . Alcohol Use: No    OB History    Grav Para Term Preterm Abortions TAB SAB Ect Mult Living                  Review  of Systems  Constitutional: Negative for fever and chills.  HENT: Positive for facial swelling. Negative for congestion, sore throat, neck pain and neck stiffness.   Eyes: Positive for redness and itching. Negative for pain.  Respiratory: Negative for cough, chest tightness and wheezing.   Cardiovascular: Positive for chest pain. Negative for palpitations.  Gastrointestinal: Negative for nausea, vomiting and abdominal pain.  Musculoskeletal: Negative.   Skin: Positive for rash.  Neurological: Negative for dizziness and weakness.    Allergies  Review of patient's allergies indicates no known allergies.  Home Medications  No current outpatient prescriptions on file.  BP 156/78  Pulse 63  Temp 98.3 F (36.8 C) (Oral)  Resp 18  Ht 5\' 6"  (1.676 m)  Wt 132 lb (59.875 kg)  BMI 21.31 kg/m2  SpO2 99%  Physical Exam  Nursing note and vitals reviewed. Constitutional: She is oriented to person, place, and time. She appears well-developed and well-nourished. No distress.  HENT:  Head: Normocephalic and atraumatic.  Right Ear: External ear normal.  Left Ear: External ear normal.  Nose: Nose normal.  Mouth/Throat: Oropharynx is clear and moist.       Lips appear normal, uvula and tongue normal. TMs normal bilaterally  Eyes: Conjunctivae are normal. Pupils are equal, round, and reactive to light.  Neck: Normal range of motion. Neck supple.  Cardiovascular: Normal rate, regular rhythm and normal heart sounds.   Pulmonary/Chest: Effort normal and breath sounds  normal. No respiratory distress. She has no wheezes. She has no rales.  Musculoskeletal: She exhibits no edema.  Lymphadenopathy:    She has no cervical adenopathy.  Neurological: She is alert and oriented to person, place, and time.  Skin: Skin is warm and dry.       No hives at present  Psychiatric: She has a normal mood and affect.    ED Course  Procedures (including critical care time)  Pt with intermittent hives, today  lip swelling. No new products/foods, not on any  Medications. Symptoms now resolved with no treatment. i suspect pt may be exposed to something in her house, possibly detergent. She does not use any chemicals for cleaning. Will start on H1, H2 blockers, prednisone since had facial swelling, follow up with PCP. Advised to switch to hypoallergenic detergent.     1. Angio-edema-urticaria       MDM          Lottie Mussel, PA 07/31/11 1544

## 2011-07-31 NOTE — ED Notes (Signed)
Pt states she was bit by a flying insect 2 weeks ago when she was in her car-states that she has been having generalized intermittent hives since.  States she was told yesterday by her doctor to start Zantac, but she has not had a chance to start it yet.  States she began developing itching and swelling to her lips at 2100 hours last night.  States it started left lower bottom lip and when she awoke this AM she felt like her top lip was swelling-no difficulty speaking or swallowing.  Denies any SOB-states when she does develop the hives they are on her inner bilateral arms/abd area and center of chest and inner thighs-associated with itching.  States she has them when she awakens in the AM and only last a few hours.

## 2011-12-11 ENCOUNTER — Other Ambulatory Visit: Payer: Self-pay | Admitting: Obstetrics and Gynecology

## 2011-12-11 DIAGNOSIS — Z1231 Encounter for screening mammogram for malignant neoplasm of breast: Secondary | ICD-10-CM

## 2012-01-19 ENCOUNTER — Ambulatory Visit
Admission: RE | Admit: 2012-01-19 | Discharge: 2012-01-19 | Disposition: A | Discharge status: Home / Self Care | Payer: BC Managed Care – PPO | Source: Ambulatory Visit | Attending: Obstetrics and Gynecology | Admitting: Obstetrics and Gynecology

## 2012-01-19 DIAGNOSIS — Z1231 Encounter for screening mammogram for malignant neoplasm of breast: Secondary | ICD-10-CM

## 2013-02-21 ENCOUNTER — Other Ambulatory Visit: Payer: Self-pay

## 2013-02-21 DIAGNOSIS — Z1231 Encounter for screening mammogram for malignant neoplasm of breast: Secondary | ICD-10-CM

## 2013-03-22 ENCOUNTER — Ambulatory Visit: Payer: BC Managed Care – PPO

## 2013-05-04 ENCOUNTER — Encounter (HOSPITAL_COMMUNITY): Payer: Self-pay | Admitting: Emergency Medicine

## 2013-05-04 ENCOUNTER — Emergency Department (HOSPITAL_COMMUNITY)
Admission: EM | Admit: 2013-05-04 | Discharge: 2013-05-04 | Discharge status: Against Medical Advice | Payer: BC Managed Care – PPO | Attending: Emergency Medicine | Admitting: Emergency Medicine

## 2013-05-04 DIAGNOSIS — R079 Chest pain, unspecified: Secondary | ICD-10-CM | POA: Insufficient documentation

## 2013-05-04 LAB — BASIC METABOLIC PANEL
BUN: 14 mg/dL (ref 6–23)
CALCIUM: 10.3 mg/dL (ref 8.4–10.5)
CHLORIDE: 103 meq/L (ref 96–112)
CO2: 25 meq/L (ref 19–32)
CREATININE: 0.78 mg/dL (ref 0.50–1.10)
GFR calc Af Amer: >90 mL/min (ref >90)
GFR calc non Af Amer: >90 mL/min (ref >90)
Glucose, Bld: 93 mg/dL (ref 70–99)
Potassium: 4.2 mEq/L (ref 3.7–5.3)
Sodium: 141 mEq/L (ref 137–147)

## 2013-05-04 LAB — CBC
HEMATOCRIT: 38.5 % (ref 36.0–46.0)
Hemoglobin: 12.8 g/dL (ref 12.0–15.0)
MCH: 29.6 pg (ref 26.0–34.0)
MCHC: 33.2 g/dL (ref 30.0–36.0)
MCV: 89.1 fL (ref 78.0–100.0)
PLATELETS: 229 10*3/uL (ref 150–400)
RBC: 4.32 MIL/uL (ref 3.87–5.11)
RDW: 12.3 % (ref 11.5–15.5)
WBC: 4.2 10*3/uL (ref 4.0–10.5)

## 2013-05-04 LAB — I-STAT TROPONIN, ED: TROPONIN I, POC: 0 ng/mL (ref 0.00–0.08)

## 2013-05-04 NOTE — ED Notes (Signed)
Pt states that she woke up this morning feeling like her chest needed to "pop".  States that she felt like something was out of place and if it would pop, it would feel better.  Denies NVD.

## 2013-05-04 NOTE — ED Notes (Signed)
Pt requesting to leave. States she feels better and will make MD appt . In the AM

## 2013-11-18 ENCOUNTER — Emergency Department (HOSPITAL_COMMUNITY): Payer: BC Managed Care – PPO

## 2013-11-18 ENCOUNTER — Emergency Department (HOSPITAL_COMMUNITY)
Admission: EM | Admit: 2013-11-18 | Discharge: 2013-11-19 | Disposition: A | Discharge status: Home / Self Care | Payer: BC Managed Care – PPO | Attending: Emergency Medicine | Admitting: Emergency Medicine

## 2013-11-18 ENCOUNTER — Encounter (HOSPITAL_COMMUNITY): Payer: Self-pay | Admitting: Emergency Medicine

## 2013-11-18 DIAGNOSIS — M25512 Pain in left shoulder: Secondary | ICD-10-CM

## 2013-11-18 DIAGNOSIS — Z862 Personal history of diseases of the blood and blood-forming organs and certain disorders involving the immune mechanism: Secondary | ICD-10-CM | POA: Insufficient documentation

## 2013-11-18 DIAGNOSIS — R200 Anesthesia of skin: Secondary | ICD-10-CM | POA: Diagnosis present

## 2013-11-18 LAB — CBC WITH DIFFERENTIAL/PLATELET
BASOS PCT: 1 % (ref 0–1)
Basophils Absolute: 0 10*3/uL (ref 0.0–0.1)
EOS ABS: 0.2 10*3/uL (ref 0.0–0.7)
EOS PCT: 3 % (ref 0–5)
HCT: 36.9 % (ref 36.0–46.0)
HEMOGLOBIN: 12.3 g/dL (ref 12.0–15.0)
LYMPHS ABS: 1.9 10*3/uL (ref 0.7–4.0)
Lymphocytes Relative: 36 % (ref 12–46)
MCH: 29.5 pg (ref 26.0–34.0)
MCHC: 33.3 g/dL (ref 30.0–36.0)
MCV: 88.5 fL (ref 78.0–100.0)
MONO ABS: 0.4 10*3/uL (ref 0.1–1.0)
MONOS PCT: 7 % (ref 3–12)
Neutro Abs: 2.9 10*3/uL (ref 1.7–7.7)
Neutrophils Relative %: 53 % (ref 43–77)
Platelets: 211 10*3/uL (ref 150–400)
RBC: 4.17 MIL/uL (ref 3.87–5.11)
RDW: 12.8 % (ref 11.5–15.5)
WBC: 5.4 10*3/uL (ref 4.0–10.5)

## 2013-11-18 LAB — BASIC METABOLIC PANEL
Anion gap: 15 (ref 5–15)
BUN: 12 mg/dL (ref 6–23)
CALCIUM: 10.1 mg/dL (ref 8.4–10.5)
CO2: 23 mEq/L (ref 19–32)
CREATININE: 0.8 mg/dL (ref 0.50–1.10)
Chloride: 102 mEq/L (ref 96–112)
GFR, EST NON AFRICAN AMERICAN: 84 mL/min — AB (ref >90)
GLUCOSE: 100 mg/dL — AB (ref 70–99)
Potassium: 3.7 mEq/L (ref 3.7–5.3)
Sodium: 140 mEq/L (ref 137–147)

## 2013-11-18 LAB — I-STAT TROPONIN, ED: Troponin i, poc: 0.01 ng/mL (ref 0.00–0.08)

## 2013-11-18 NOTE — ED Notes (Signed)
Pt arrived to the ED with a complaint of left arm numbness.  Pt also states that her left shoulder felt weak.  Pt states's symptoms happened around 1930 hrs tonight.  Pt states that she felt flush and hot at the time.

## 2013-11-18 NOTE — ED Provider Notes (Signed)
CSN: [id number]     Arrival date & time 11/18/13  2010 History   First MD Initiated Contact with Patient 11/18/13 2142     Chief Complaint  Patient presents with  . Numbness     (Consider location/radiation/quality/duration/timing/severity/associated sxs/prior Treatment) HPI 53 yo female presents with left shoulder pain and finger tip numbness for past 2 hours. Comes and goes, lasts a few minutes at a time. No chest pain. No dyspnea. No headache or leg symptoms. Pain does not radiate. No shoulder injury. No diaphoresis, nausea or back pain. No swelling or redness. No prior history of shoulder pain like this. No hx of hypertension, hyperlipidemia, DM, or smoking. No pleuritic symptoms or leg swelling. No pain currently.   Past Medical History  Diagnosis Date  . Anemia    Past Surgical History  Procedure Laterality Date  . Diagnostic laparoscopy      laser for endometriosis  . Abcess removal  age 28    on buttocks  . Laparoscopic assisted vaginal hysterectomy  05/19/2011    Procedure: LAPAROSCOPIC ASSISTED VAGINAL HYSTERECTOMY;  Surgeon: Darlyn Chamber, MD;  Location: [facility];  Service: Gynecology;  Laterality: N/A;  Cystoscopy  . Salpingoophorectomy  05/19/2011    Procedure: SALPINGO OOPHERECTOMY;  Surgeon: Darlyn Chamber, MD;  Location: [facility];  Service: Gynecology;  Laterality: Bilateral;   History reviewed. No pertinent family history. History  Substance Use Topics  . Smoking status: Never Smoker   . Smokeless tobacco: Not on file  . Alcohol Use: No   OB History   Grav Para Term Preterm Abortions TAB SAB Ect Mult Living                 Review of Systems  Constitutional: Negative for fever and diaphoresis.  Respiratory: Negative for shortness of breath.   Cardiovascular: Negative for chest pain.  Gastrointestinal: Negative for nausea, vomiting and abdominal pain.  Musculoskeletal: Positive for arthralgias. Negative for neck pain.  Neurological: Positive for numbness.  Negative for weakness.  All other systems reviewed and are negative.     Allergies  Review of patient's allergies indicates no known allergies.  Home Medications   Prior to Admission medications   Medication Sig Start Date End Date Taking? Authorizing Provider  Cholecalciferol (VITAMIN D) 2000 UNITS CAPS Take 2,000 Units by mouth every other day.   Yes Historical Provider, MD   BP 131/68  Pulse 54  Temp(Src) 98 F (36.7 C) (Oral)  Resp 16  Ht 5\' 6"  (1.676 m)  Wt 141 lb 1.6 oz (64.003 kg)  BMI 22.79 kg/m2  SpO2 98%  LMP 04/29/2011 Physical Exam  Nursing note and vitals reviewed. Constitutional: She is oriented to person, place, and time. She appears well-developed and well-nourished. No distress.  HENT:  Head: Normocephalic and atraumatic.  Right Ear: External ear normal.  Left Ear: External ear normal.  Nose: Nose normal.  Eyes: Right eye exhibits no discharge. Left eye exhibits no discharge.  Cardiovascular: Normal rate, regular rhythm and normal heart sounds.   Pulses:      Radial pulses are 2+ on the right side, and 2+ on the left side.  Pulmonary/Chest: Effort normal and breath sounds normal. She has no wheezes. She has no rales.  Abdominal: Soft. There is no tenderness.  Musculoskeletal: Normal range of motion. She exhibits no edema.       Left shoulder: She exhibits normal range of motion, no tenderness and no deformity.  Neurological: She is alert and oriented to  person, place, and time.  Reflex Scores:      Bicep reflexes are 2+ on the right side and 2+ on the left side.      Patellar reflexes are 2+ on the right side and 2+ on the left side.      Achilles reflexes are 2+ on the right side and 2+ on the left side. CN 2-12 grossly intact. 5/5 strength in all 4 extremities. Grossly normal sensation.  Skin: Skin is warm and dry. She is not diaphoretic.    ED Course  Procedures (including critical care time) Labs Review Labs Reviewed  BASIC METABOLIC PANEL -  Abnormal; Notable for the following:    Glucose, Bld 100 (*)    GFR calc non Af Amer 84 (*)    All other components within normal limits  CBC WITH DIFFERENTIAL  I-STAT TROPOININ, ED    Imaging Review Dg Chest 2 View  11/18/2013   CLINICAL DATA:  Left arm numbness in shoulder pain and weakness.  EXAM: CHEST  2 VIEW  COMPARISON:  None.  FINDINGS: The heart size and mediastinal contours are within normal limits. Both lungs are clear. The visualized skeletal structures are unremarkable.  IMPRESSION: No active cardiopulmonary disease.   Electronically Signed   By: Lucienne Capers M.D.   On: 11/18/2013 22:29   Dg Shoulder Left  11/18/2013   CLINICAL DATA:  Left arm numbness and superior anterior shoulder pain. Weakness in the left shoulder. Symptoms began at 1930 hr tonight. No reported injury.  EXAM: LEFT SHOULDER - 2+ VIEW  COMPARISON:  None.  FINDINGS: There is no evidence of fracture or dislocation. There is no evidence of arthropathy or other focal bone abnormality. Soft tissues are unremarkable.  IMPRESSION: Negative.   Electronically Signed   By: Lucienne Capers M.D.   On: 11/18/2013 22:29     EKG Interpretation   Date/Time:  Friday November 18 2013 20:45:21 EDT Ventricular Rate:  58 PR Interval:  123 QRS Duration: 82 QT Interval:  442 QTC Calculation: 434 R Axis:   51 Text Interpretation:  Sinus rhythm Probable left atrial enlargement  Probable left ventricular hypertrophy Anterior Q waves, possibly due to  LVH Baseline wander in lead(s) II III aVF No significant change since  April 2015 Confirmed by Regenia Skeeter  MD, Sadiya Durand ([id number]) on 11/18/2013 9:52:19  PM      MDM   Final diagnoses:  Left shoulder pain    Patient with atraumatic left shoulder pain. No pleuritic or exertional symptoms. I was not able to reproduce her symptoms on exam but during shoulder xrays she states her exact pain was reproduced when techs were manipulating her shoulder. She is low risk for ACS and has  benign and unchanged EKG. At this point will get delta troponin, and if negative I feel she's stable for outpatient workup with her PCP. Care transferred with 2nd troponin pending.     Ephraim Hamburger, MD 11/19/13 [phone]

## 2013-11-19 LAB — TROPONIN I

## 2013-11-19 NOTE — Discharge Instructions (Signed)
Arthralgia Ms. Sabra Heck, you were seen today for shoulder pain. Your heart enzymes and EKG were normal and were checked twice.  Followup with a primary care physician within 3 days for continued treatment. If any symptoms worsen come back to emergency department immediately for repeat evaluation.  thank you.  Arthralgia is joint pain. A joint is a place where two bones meet. Joint pain can happen for many reasons. The joint can be bruised, stiff, infected, or weak from aging. Pain usually goes away after resting and taking medicine for soreness.  HOME CARE  Rest the joint as told by your doctor.  Keep the sore joint raised (elevated) for the first 24 hours.  Put ice on the joint area.  Put ice in a plastic bag.  Place a towel between your skin and the bag.  Leave the ice on for 15-20 minutes, 03-04 times a day.  Wear your splint, casting, elastic bandage, or sling as told by your doctor.  Only take medicine as told by your doctor. Do not take aspirin.  Use crutches as told by your doctor. Do not put weight on the joint until told to by your doctor. GET HELP RIGHT AWAY IF:   You have bruising, puffiness (swelling), or more pain.  Your fingers or toes turn blue or start to lose feeling (numb).  Your medicine does not lessen the pain.  Your pain becomes severe.  You have a temperature by mouth above 102 F (38.9 C), not controlled by medicine.  You cannot move or use the joint. MAKE SURE YOU:   Understand these instructions.  Will watch your condition.  Will get help right away if you are not doing well or get worse. Document Released: 01/01/2009 Document Revised: 04/07/2011 Document Reviewed: 01/01/2009 [facility] Patient Information 2015 [place], [place]. This information is not intended to replace advice given to you by your health care provider. Make sure you discuss any questions you have with your health care provider.  Emergency Department Resource Guide 1) Find a Doctor  and Pay Out of Pocket Although you won't have to find out who is covered by your insurance plan, it is a good idea to ask around and get recommendations. You will then need to call the office and see if the doctor you have chosen will accept you as a new patient and what types of options they offer for patients who are self-pay. Some doctors offer discounts or will set up payment plans for their patients who do not have insurance, but you will need to ask so you aren't surprised when you get to your appointment.  2) Contact Your Local Health Department Not all health departments have doctors that can see patients for sick visits, but many do, so it is worth a call to see if yours does. If you don't know where your local health department is, you can check in your phone book. The CDC also has a tool to help you locate your state's health department, and many state websites also have listings of all of their local health departments.  3) Find a [facility] If your illness is not likely to be very severe or complicated, you may want to try a walk in clinic. These are popping up all over the country in pharmacies, drugstores, and shopping centers. They're usually staffed by nurse practitioners or physician assistants that have been trained to treat common illnesses and complaints. They're usually fairly quick and inexpensive. However, if you have serious medical issues or  chronic medical problems, these are probably not your best option.  No Primary Care Doctor: - Call Health Connect at  [phone] - they can help you locate a primary care doctor that  accepts your insurance, provides certain services, etc. - Physician Referral Service- [phone]  Chronic Pain Problems: Organization         Address  Phone   Notes  [facility]  [phone] Patients need to be referred by their primary care doctor.   Medication Assistance: Organization         Address  Phone    Notes  [facility] Medication [facility] [place] [place], [place] [address] [phone] --Must be a resident of [facility] -- Must have NO insurance coverage whatsoever (no Medicaid/ Medicare, etc.) -- The pt. MUST have a primary care doctor that directs their care regularly and follows them in the community   MedAssist  [phone]   [employer]  [phone]    Agencies that provide inexpensive medical care: Organization         Address  Phone   Notes  [place]  [phone]   [facility]    [phone]   [facility] [place], [place] [address] [phone]   [place] [address] [place] [phone]   Planned Parenthood    [phone]   [facility]    [phone]   [place] [address], [place] Phone:  [phone], Fax:  [phone] Hours of Operation:  9 am - 6 pm, M-F.  Also accepts Medicaid/Medicare and self-pay.  [facility] for [place], [address], [place] Phone: [phone], Fax: [phone]. Hours of Operation:  8:30 am - 5:30 pm, M-F.  Also accepts Medicaid and self-pay.  [facility] [place] [address] [place] Phone: [phone]   [place], [place], [place] [phone], Ext. [phone] Mondays & Thursdays: 7-9 AM.  First 15 patients are seen on a first come, first serve basis.    [place] Providers:  Organization         Address  Phone   Notes  [facility] [address], [place] [phone] Also accepts self-pay patients.  [facility] [address], [place]  [phone]   [place], [address], [place] [phone]   [facility] Family  Medicine [address] [place] [phone]   Lucianne Lei [address], [place]   [phone] Only accepts [place] Access [place] patients after they have their name applied to their card.   Self-Pay (no insurance) in [facility]:  Organization         Address  Phone   Notes  Sickle Cell Patients, [facility] Internal Medicine [place] [phone]   [facility] Urgent Care [place] [phone]   [facility] [place]  [place], [place], [place] [phone]   [facility]/Dr. Osei-Bonsu  [address] [place] or [address], [employer] [phone] Phone number for both [place] and [place] locations is the same.  Urgent Medical and [facility] [address] [place] [phone]   [facility] [address], [place] or [address] [phone]   [facility] [address], [place] [phone], phone; [phone], fax Sees patients 1st and 3rd Saturday of every month.  Must not qualify for public or private insurance (i.e. Medicaid, Medicare, [place] Health Choice, Veterans' Benefits)  Household income should be no more than 200% of the poverty level The clinic cannot treat you if you are pregnant or think you are pregnant  Sexually transmitted diseases are not treated at the clinic.    Dental Care: Organization         Address  Phone  Notes  [facility] Department of [facility] [place] [phone] Accepts children up to age 54 who are enrolled in [place] or [place]; pregnant women with a Medicaid card; and children who have applied for Medicaid or [place] Health Choice, but were declined, whose parents can pay a reduced fee at time of service.  [facility] Department of [facility]  [address], [place] [phone] Accepts children up to age 57 who are enrolled in [place] or [place]; pregnant women with a Medicaid card; and children who have applied for Medicaid or  Health Choice, but were declined, whose parents can pay a reduced fee at time of service.  [facility] Access PRO[facility]  [place] [phone] Patients are seen by appointment only. Walk-ins are not accepted. [place] will see patients 31 years of age and older. Monday - Tuesday (8am-5pm) Most Wednesdays (8:30-5pm) $30 per visit, cash only  [facility] Adult Dental Access PROGRAM  [address], [facility] [phone] Patients are seen by appointment only. Walk-ins are not accepted. [place] will see patients 5 years of age and older. One Wednesday Evening (Monthly: Volunteer Based).  $30 per visit, cash only  [employer]  [phone] for adults; Children under age 66, call Graduate Pediatric Dentistry at [phone]. Children aged 18-14, please call [phone] to request a pediatric application.  Dental services are provided in all areas of dental care including fillings, crowns and bridges, complete and partial dentures, implants, gum treatment, root canals, and extractions. Preventive care is also provided. Treatment is provided to both adults and children. Patients are selected via a lottery and there is often a waiting list.   [facility] [address] [place]  [phone] [email or web address]   [facility] [address] [place], [place] [phone], Ext. [phone] Second and Fourth Thursday of each month, opens at 6:30 AM; Clinic ends at 9 AM.  Patients are seen on a first-come first-served basis, and a limited number are seen during each clinic.   [facility]  [address] Hillard Danker [place], [place] [phone]   Eligibility Requirements You must have lived in  [place], [place], or [place] counties for at least the last three months.   You cannot be eligible for state or federal sponsored [employer], including [employer], [place], or [employer].   You generally cannot be eligible for healthcare insurance through your employer.    How to apply: Eligibility screenings are held every Tuesday and Wednesday afternoon from 1:00 pm until 4:00 pm. You do not need an appointment for the interview!  [facility] [address], [place], [place]   [place]  [place] Department  [place]  [phone]    Behavioral Health Resources in the Community: Intensive Outpatient Programs Organization         Address  Phone  Notes  [place]. [address] [place], [place] [phone]   [facility] Outpatient [address], [place], [place]   ADS: [facility] [address], [place], [place]   [place] [address]  [place], [place] or [phone]   Substance Abuse Resources Organization         Address  Phone  Notes  Al[facility] Drug Services  [phone]   [place]  [phone]   [facility] [place]   Chinita Pester  [phone]   Residential & Outpatient Substance Abuse Program  [phone]   Psychological Services Organization         Address  Phone  Notes  [facility] [place]  [place]  [phone]   [place] [address], [place] or [phone]    Mobile Crisis Teams Organization         Address  Phone  Notes  [facility], Mobile Crisis Care Unit  [phone]   [facility]  [address] [place], [place]   [place] [address], [place] [phone]    Self-Help/Support Groups Organization         Address  Phone             Notes  [place]. of [place] - variety of support groups  Pioneer Call for more information  Narcotics Anonymous (NA), [facility] [address], [employer] [place]  2 meetings at this location   Special educational needs teacher         Address  Phone  Notes  [facility] [place],    [place]  [phone]   [facility]  [address] [place] [address], [place], [place]   [place], [place] [phone] Admissions: 8am-3pm M-F  Incentives Substance [place] [address]    [place], [place] [phone]   [facility] [address] [place], [place], [place]   [facility] [address],  [place], [place]   [facility] Programs - Intensive Outpatient [address], [place], [place]   [facility] ([place].,  [place], [place] [phone] or [phone]   [facility] (RTS) [address], [place], [place] Accepts Medicaid  [facility] [place] [address],  [place] [phone] Substance Abuse/Addiction Treatment   [facility] Organization         Address  Phone  Notes  [facility]  [phone]   Domenic Schwab, PhD [address] [place]lis [place], [place]   [phone] or [phone]   [place], [place] [phone]   [facility] [address], [place], [place] [phone] Insurance/Medicaid/sponsorship through [employer] and Families [address] [facility] [address]  [place], [place] [phone] [place], [place] [phone]    Dr. Adele Schilder  [phone]   [facility] Dept. 1) [address], [place] 2) [place] 3)  [place] [address], [place] [phone]  [phone]   [place] [phone] or [phone] (After Hours)

## 2013-11-19 NOTE — ED Provider Notes (Signed)
Patient was signed out as pending troponin at 1 AM and EKG. EKG is unchanged from previous, troponin is negative. Upon my repeat assessment her heart and lung exam is normal. Patient no longer having any left shoulder pain or numbness. Patient was advised to followup with primary care physician within 3 days for continued evaluation. Her vital signs remain within her normal limits and she is safe for discharge.  Everlene Balls, MD 11/19/13 [phone]

## 2015-06-29 IMAGING — CR DG SHOULDER 2+V*L*
3 series · 3 of 3 positions shown · non-contrast
Comparison: None.

CLINICAL DATA: Left arm numbness and superior anterior shoulder
pain. Weakness in the left shoulder. Symptoms began at 4735 hr
tonight. No reported injury.

EXAM:
LEFT SHOULDER - 2+ VIEW

[w shoulder external left]
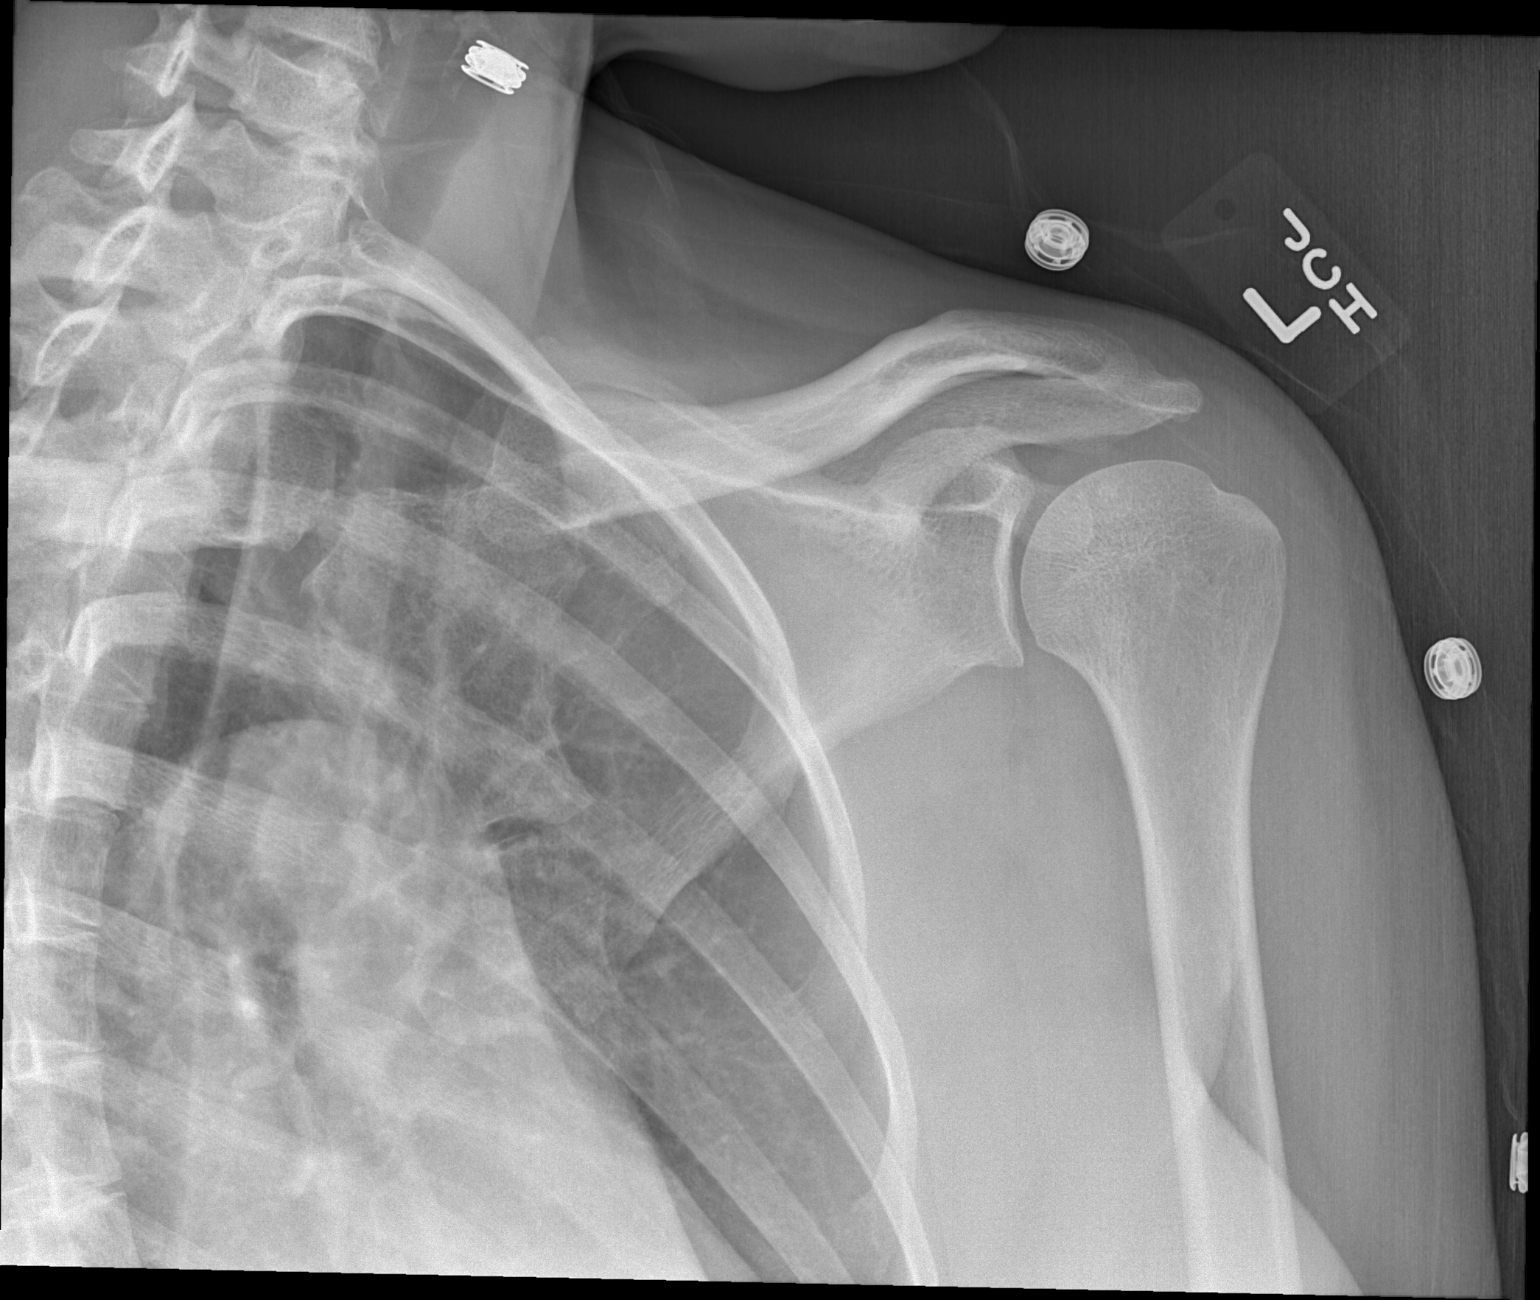

[w shoulder y-view left]
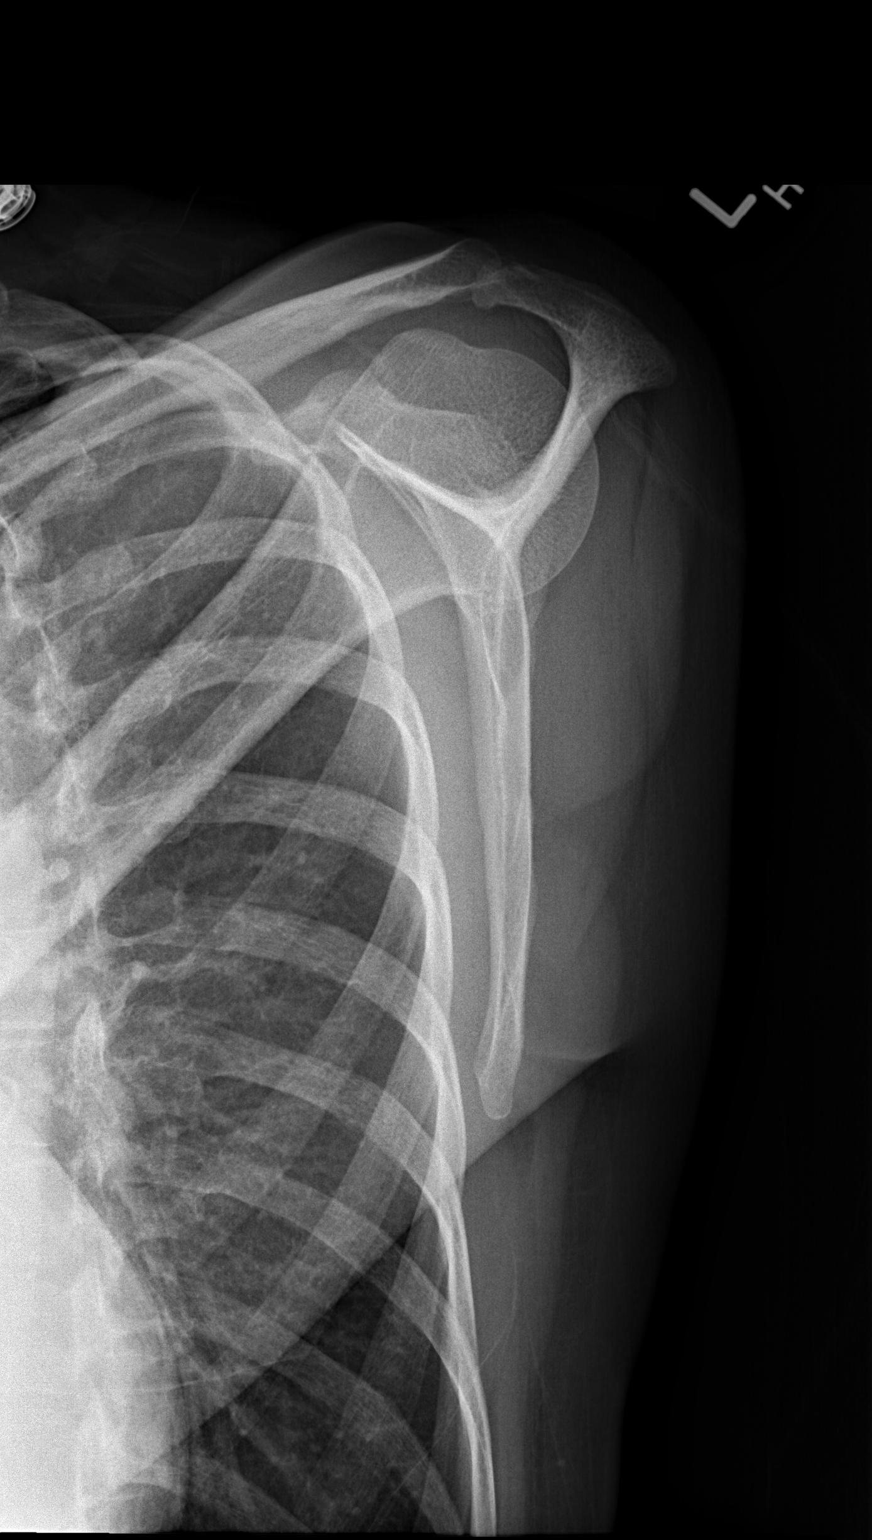

[x shoulder axillary left]
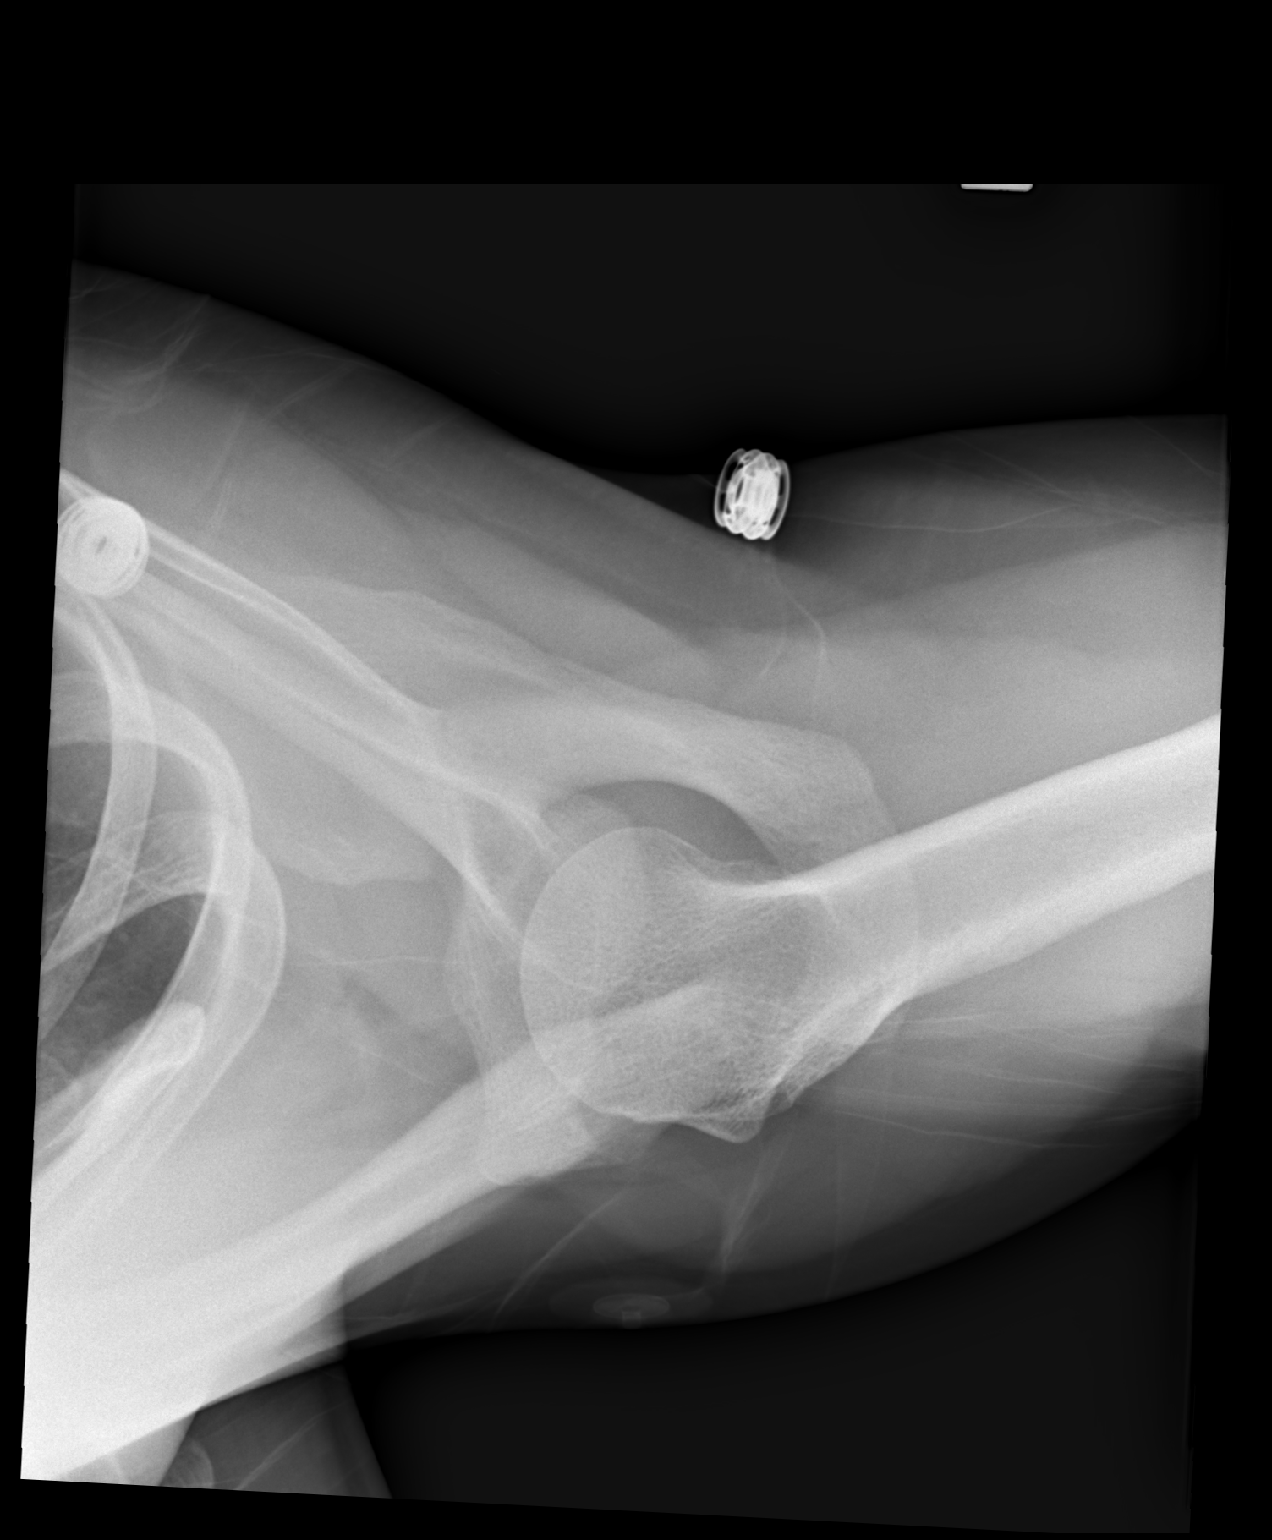

[3 of 3 positions shown; findings below may reference images not displayed]

FINDINGS: There is no evidence of fracture or dislocation. There is no
evidence of arthropathy or other focal bone abnormality. Soft
tissues are unremarkable.
IMPRESSION: Negative.

## 2015-06-29 IMAGING — CR DG CHEST 2V
2 series · 2 of 2 positions shown · non-contrast
Comparison: None.

CLINICAL DATA: Left arm numbness in shoulder pain and weakness.

EXAM:
CHEST  2 VIEW

[w chest pa]
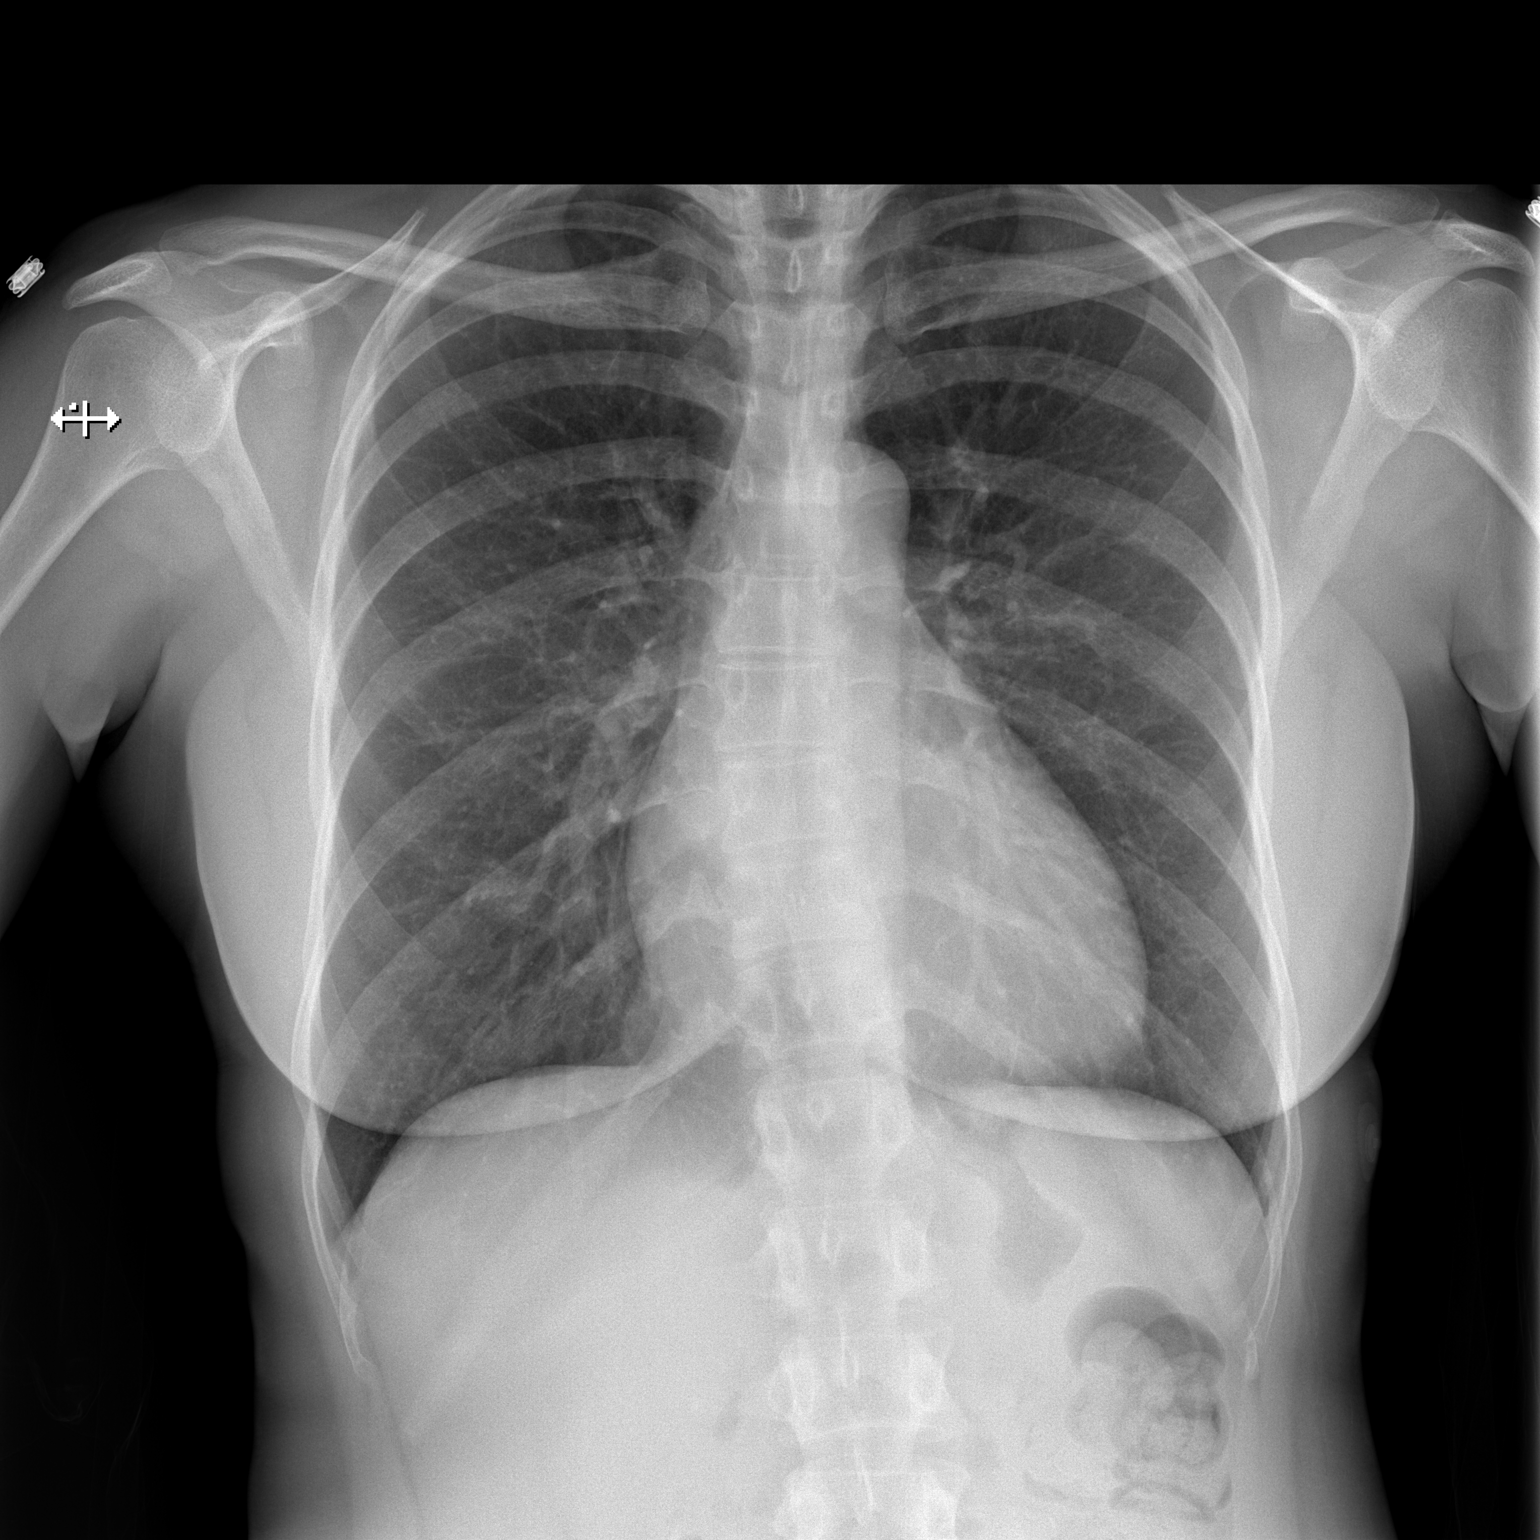

[w chest lat]
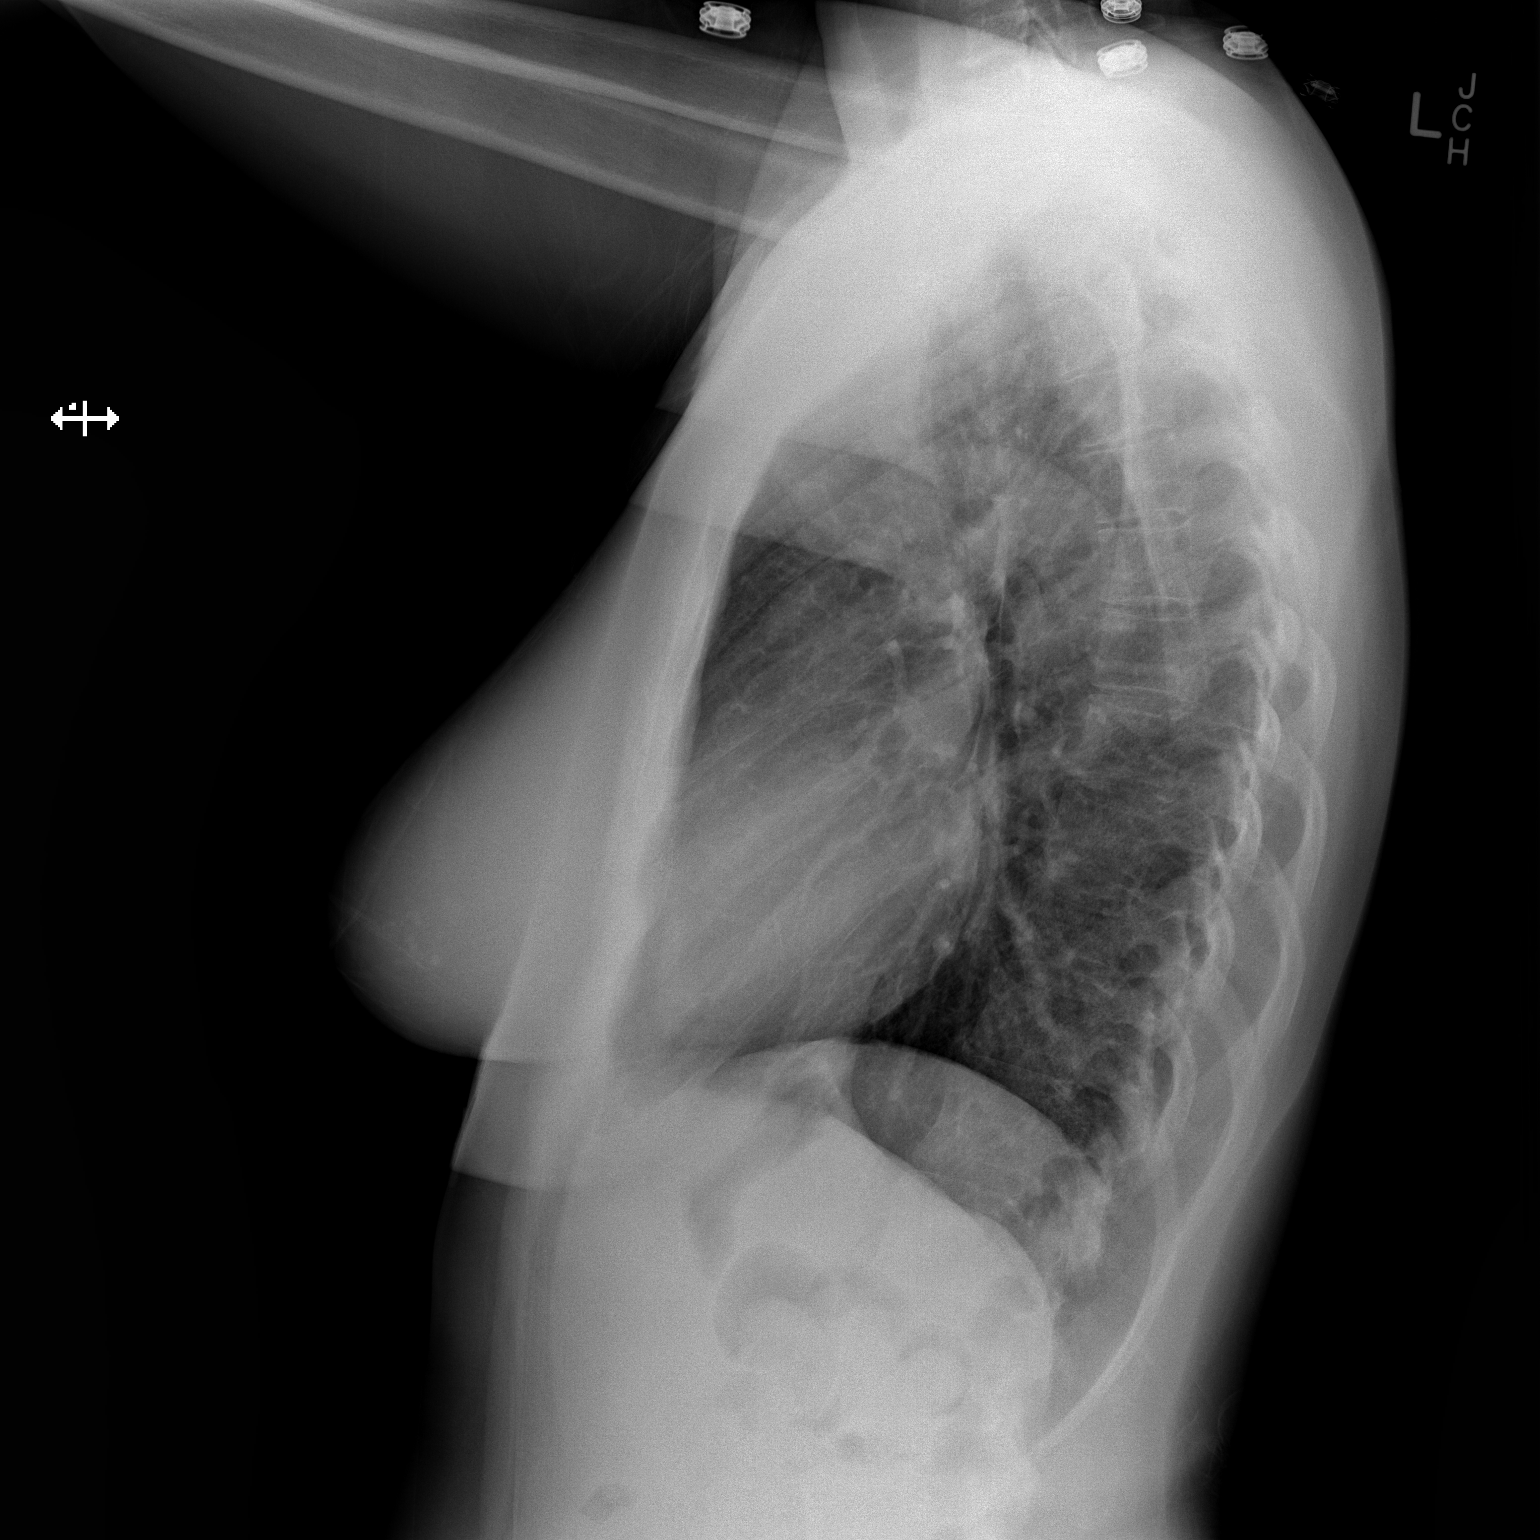

[2 of 2 positions shown; findings below may reference images not displayed]

FINDINGS: The heart size and mediastinal contours are within normal limits.
Both lungs are clear. The visualized skeletal structures are
unremarkable.
IMPRESSION: No active cardiopulmonary disease.

## 2017-05-20 ENCOUNTER — Other Ambulatory Visit: Payer: Self-pay | Admitting: Obstetrics and Gynecology

## 2017-05-20 DIAGNOSIS — N63 Unspecified lump in unspecified breast: Secondary | ICD-10-CM

## 2017-05-25 ENCOUNTER — Other Ambulatory Visit: Payer: BC Managed Care – PPO

## 2017-07-17 ENCOUNTER — Ambulatory Visit
Admission: RE | Admit: 2017-07-17 | Discharge: 2017-07-17 | Disposition: A | Discharge status: Home / Self Care | Payer: BC Managed Care – PPO | Source: Ambulatory Visit | Attending: Obstetrics and Gynecology | Admitting: Obstetrics and Gynecology

## 2017-07-17 ENCOUNTER — Ambulatory Visit: Payer: BC Managed Care – PPO

## 2017-07-17 DIAGNOSIS — N63 Unspecified lump in unspecified breast: Secondary | ICD-10-CM

## 2022-09-29 ENCOUNTER — Emergency Department (HOSPITAL_COMMUNITY)
Admission: EM | Admit: 2022-09-29 | Discharge: 2022-09-29 | Disposition: A | Discharge status: Home / Self Care | Payer: BC Managed Care – PPO | Attending: Emergency Medicine | Admitting: Emergency Medicine

## 2022-09-29 ENCOUNTER — Emergency Department (HOSPITAL_COMMUNITY): Payer: BC Managed Care – PPO

## 2022-09-29 ENCOUNTER — Encounter (HOSPITAL_COMMUNITY): Payer: Self-pay

## 2022-09-29 DIAGNOSIS — R55 Syncope and collapse: Secondary | ICD-10-CM | POA: Diagnosis present

## 2022-09-29 DIAGNOSIS — R110 Nausea: Secondary | ICD-10-CM | POA: Diagnosis not present

## 2022-09-29 LAB — CBC WITH DIFFERENTIAL/PLATELET
Abs Immature Granulocytes: 0.02 10*3/uL (ref 0.00–0.07)
Basophils Absolute: 0 10*3/uL (ref 0.0–0.1)
Basophils Relative: 1 %
Eosinophils Absolute: 0.1 10*3/uL (ref 0.0–0.5)
Eosinophils Relative: 1 %
HCT: 40.1 % (ref 36.0–46.0)
Hemoglobin: 13.2 g/dL (ref 12.0–15.0)
Immature Granulocytes: 0 %
Lymphocytes Relative: 19 %
Lymphs Abs: 1.1 10*3/uL (ref 0.7–4.0)
MCH: 30.8 pg (ref 26.0–34.0)
MCHC: 32.9 g/dL (ref 30.0–36.0)
MCV: 93.7 fL (ref 80.0–100.0)
Monocytes Absolute: 0.4 10*3/uL (ref 0.1–1.0)
Monocytes Relative: 7 %
Neutro Abs: 4.2 10*3/uL (ref 1.7–7.7)
Neutrophils Relative %: 72 %
Platelets: 215 10*3/uL (ref 150–400)
RBC: 4.28 MIL/uL (ref 3.87–5.11)
RDW: 12.4 % (ref 11.5–15.5)
WBC: 5.9 10*3/uL (ref 4.0–10.5)
nRBC: 0 % (ref 0.0–0.2)

## 2022-09-29 LAB — COMPREHENSIVE METABOLIC PANEL
ALT: 12 U/L (ref 0–44)
AST: 17 U/L (ref 15–41)
Albumin: 3.7 g/dL (ref 3.5–5.0)
Alkaline Phosphatase: 88 U/L (ref 38–126)
Anion gap: 17 — ABNORMAL HIGH (ref 5–15)
BUN: 11 mg/dL (ref 6–20)
CO2: 24 mmol/L (ref 22–32)
Calcium: 9.6 mg/dL (ref 8.9–10.3)
Chloride: 100 mmol/L (ref 98–111)
Creatinine, Ser: 0.9 mg/dL (ref 0.44–1.00)
GFR, Estimated: >60 mL/min (ref >60)
Glucose, Bld: 106 mg/dL — ABNORMAL HIGH (ref 70–99)
Potassium: 4 mmol/L (ref 3.5–5.1)
Sodium: 141 mmol/L (ref 135–145)
Total Bilirubin: 0.8 mg/dL (ref 0.3–1.2)
Total Protein: 7.4 g/dL (ref 6.5–8.1)

## 2022-09-29 LAB — D-DIMER, QUANTITATIVE: D-Dimer, Quant: <0.27 ug{FEU}/mL (ref 0.00–0.50)

## 2022-09-29 LAB — TROPONIN I (HIGH SENSITIVITY)
Troponin I (High Sensitivity): 3 ng/L (ref <18)
Troponin I (High Sensitivity): 4 ng/L (ref <18)

## 2022-09-29 LAB — CBG MONITORING, ED: Glucose-Capillary: 83 mg/dL (ref 70–99)

## 2022-09-29 MED ORDER — SODIUM CHLORIDE 0.9 % IV BOLUS
1000.0000 mL | Freq: Once | INTRAVENOUS | Status: AC
  Administered 2022-09-29: 1000 mL via INTRAVENOUS

## 2022-09-29 MED ORDER — ONDANSETRON HCL 4 MG/2ML IJ SOLN
4.0000 mg | Freq: Once | INTRAMUSCULAR | Status: DC
  Filled 2022-09-29: qty 2

## 2022-09-29 NOTE — Discharge Instructions (Signed)
Your laboratories also are within normal limits today.  Please follow-up with your primary care physician as needed, return to emergency department if your symptoms worsen.

## 2022-09-29 NOTE — ED Provider Notes (Signed)
[facility] Provider Note   CSN: [id number] Arrival date & time: 09/29/22  6578     History Anemia Chief Complaint  Patient presents with   Loss of Consciousness    Joanna Klein is a 62 y.o. female.  62 year old female with no past medical history presents to the ED via EMS status post syncopal episode.  Patient was in the kitchen with her sister this morning, when she suddenly felt nauseous, states that she felt like she was going to overheated and had an episode of syncope which lasted approximately 45 seconds according to her sister.  She was not answering to her name, seem to be somewhat slumped over.  Patient states that she feels like she has been dehydrated over the last couple days that she has not been drinking much.  She did not have any prodromal episode aside from her nausea but did not vomit.  She is also endorsing feeling some lower abdominal cramping, like she is about her getting her menstrual cycle however does have a prior history of a hysterectomy.  She denies any recent fevers, no chest pain, no shortness of breath.  No prior history of cardiac disease.  The history is provided by the patient and medical records.  Loss of Consciousness Episode history:  Single Most recent episode:  Today Duration:  45 seconds Progression:  Unchanged Chronicity:  New Context: not blood draw, not bowel movement, not exertion, not medication change and not with normal activity   Witnessed: yes   Relieved by:  Nothing Worsened by:  Nothing Associated symptoms: nausea   Associated symptoms: no chest pain, no fever, no shortness of breath and no vomiting   Risk factors: no congenital heart disease, no coronary artery disease, no seizures and no vascular disease        Home Medications Prior to Admission medications   Medication Sig Start Date End Date Taking? Authorizing Provider  Cholecalciferol (VITAMIN D) 2000 UNITS CAPS Take 2,000  Units by mouth every other day.    [provider]      Allergies    Patient has no known allergies.    Review of Systems   Review of Systems  Constitutional:  Negative for chills and fever.  Respiratory:  Negative for shortness of breath.   Cardiovascular:  Positive for syncope. Negative for chest pain.  Gastrointestinal:  Positive for nausea. Negative for abdominal pain and vomiting.  Musculoskeletal:  Negative for back pain.  Neurological:  Positive for syncope.  All other systems reviewed and are negative.   Physical Exam Updated Vital Signs BP 112/61 (BP Location: Right Arm)   Pulse (!) 47   Temp 98.3 F (36.8 C)   Resp 20   Ht 5\' 5"  (1.651 m)   Wt 58.5 kg   LMP 04/29/2011   SpO2 100%   BMI 21.47 kg/m  Physical Exam Vitals and nursing note reviewed.  Constitutional:      Appearance: Normal appearance.  HENT:     Head: Normocephalic and atraumatic.     Mouth/Throat:     Mouth: Mucous membranes are dry.  Eyes:     Pupils: Pupils are equal, round, and reactive to light.  Cardiovascular:     Rate and Rhythm: Normal rate.  Pulmonary:     Effort: Pulmonary effort is normal.     Breath sounds: No wheezing or rales.  Abdominal:     General: Abdomen is flat.     Palpations:  Abdomen is soft.     Tenderness: There is no abdominal tenderness.  Musculoskeletal:     Cervical back: Normal range of motion and neck supple.  Skin:    General: Skin is warm and dry.  Neurological:     Mental Status: She is alert and oriented to person, place, and time.     ED Results / Procedures / Treatments   Labs (all labs ordered are listed, but only abnormal results are displayed) Labs Reviewed  COMPREHENSIVE METABOLIC PANEL - Abnormal; Notable for the following components:      Result Value   Glucose, Bld 106 (*)    Anion gap 17 (*)    All other components within normal limits  CBC WITH DIFFERENTIAL/PLATELET  D-DIMER, QUANTITATIVE  CBG MONITORING, ED  TROPONIN I  (HIGH SENSITIVITY)  TROPONIN I (HIGH SENSITIVITY)    EKG EKG Interpretation Date/Time:  Monday September 29 2022 09:54:54 EDT Ventricular Rate:  59 PR Interval:  133 QRS Duration:  88 QT Interval:  404 QTC Calculation: 401 R Axis:   42  Text Interpretation: Sinus rhythm Left atrial enlargement Minimal ST depression, anterolateral leads no stemi similar to prior Confirmed by Tanda Rockers ([id number]) on 09/29/2022 11:25:08 AM  Radiology DG Chest 1 View  Result Date: 09/29/2022 CLINICAL DATA:  62 year old female with history of syncope. EXAM: CHEST  1 VIEW COMPARISON:  Chest x-ray 11/18/2013. FINDINGS: Lung volumes are normal. No consolidative airspace disease. No pleural effusions. No pneumothorax. No pulmonary nodule or mass noted. Several densely calcified right hilar and infrahilar lymph nodes are incidentally noted. Pulmonary vasculature and the cardiomediastinal silhouette are within normal limits. IMPRESSION: No radiographic evidence of acute cardiopulmonary disease. Electronically Signed   By: Trudie Reed M.D.   On: 09/29/2022 11:06    Procedures Procedures    Medications Ordered in ED Medications  ondansetron (ZOFRAN) injection 4 mg (4 mg Intravenous Patient Refused/Not Given 09/29/22 1124)  sodium chloride 0.9 % bolus 1,000 mL (1,000 mLs Intravenous New Bag/Given 09/29/22 1125)    ED Course/ Medical Decision Making/ A&P                                 Medical Decision Making Amount and/or Complexity of Data Reviewed Labs: ordered. Radiology: ordered. ECG/medicine tests: ordered.  Risk Prescription drug management.   This patient presents to the ED for concern of syncope, this involves a number of treatment options, and is a complaint that carries with it a high risk of complications and morbidity.  The differential diagnosis includes CVA, ACS, orthostatics.    Co morbidities: Discussed in HPI   Brief History:  See HPI.   EMR reviewed including pt PMHx, past  surgical history and past visits to ER.   See HPI for more details   Lab Tests:  I ordered and independently interpreted labs.  The pertinent results include:    I personally reviewed all laboratory work and imaging. Metabolic panel without any acute abnormality specifically kidney function within normal limits and no significant electrolyte abnormalities. CBC without leukocytosis or significant anemia.  CBG was normal on arrival.  First troponin is negative, she does not have any active chest pain at this time and actually never did.  Being of syncope obtain D-dimer level.   Imaging Studies:  NAD. I personally reviewed all imaging studies and no acute abnormality found. I agree with radiology interpretation.  Cardiac Monitoring:  The patient was maintained  on a cardiac monitor.  I personally viewed and interpreted the cardiac monitored which showed an underlying rhythm of: NSR EKG non-ischemic   Medicines ordered:  I ordered medication including bolus  for symptomatic treatment Reevaluation of the patient after these medicines showed that the patient improved I have reviewed the patients home medicines and have made adjustments as needed  Reevaluation:  After the interventions noted above I re-evaluated patient and found that they have :improved   Social Determinants of Health:  The patient's social determinants of health were a factor in the care of this patient   Problem List / ED Course:  Patient with no prior medical history here status post syncopal episode this morning with her sister while at home.  Did not have any oral intake.  Does endorse some nausea and felt was overheating, there was loss of consciousness.  She does not have any prior cardiac history, denies any chest pain or shortness of breath on today's visit.  Blood work overall unremarkable without any leukocytosis, no signs of anemia.  Electrolytes overall without any derangement.  Chest x-ray is normal.   Will obtain second troponin, EKG on arrival normal sinus rhythm however some concern for arrhythmia versus second-degree block, will obtain repeat as well.  Providing her with 1 L bolus to help with hydration.Delta troponins are flat, EKG confirmed by my attending Dr. Wallace Cullens.  In the setting of syncope, although no chest pain or shortness of breath, no tachycardia or tachypnea, dimer was obtained which was also negative.  She does feel improvement in her send sounds after bolus.  She is overall hemodynamically stable, blood pressure has also improved.  She is accompanied by her sister at the bedside, we discussed primary care physician follow-up.  Hemodynamically stable for discharge.  Dispostion:  After consideration of the diagnostic results and the patients response to treatment, I feel that the patent would benefit from     Portions of this note were generated with Dragon dictation software. Dictation errors may occur despite best attempts at proofreading.   Final Clinical Impression(s) / ED Diagnoses Final diagnoses:  Syncope and collapse    Rx / DC Orders ED Discharge Orders     None         Claude Manges, PA-C 09/29/22 1451    Sloan Leiter, DO 09/29/22 1616

## 2022-09-29 NOTE — ED Triage Notes (Signed)
Pt coming in from sisters house where she had a syncopal episode. Pt reports that she felt sick like she was going to pass out. Pt reports that her sister was calling her name and she was not responding.

## 2022-09-29 NOTE — ED Notes (Signed)
Pt verbalized understanding of discharge instructions. Pt ambulated from ed . Family to drive home.
